# Patient Record
Sex: Female | Born: 1975 | Race: White | Hispanic: No | Marital: Married | Smoking: Current some day smoker
Health system: Southern US, Community
[De-identification: ages and names within clinical notes are randomized; demographics above are authoritative.]

## PROBLEM LIST (undated history)

## (undated) DIAGNOSIS — K8301 Primary sclerosing cholangitis: Secondary | ICD-10-CM

## (undated) DIAGNOSIS — I8500 Esophageal varices without bleeding: Secondary | ICD-10-CM

## (undated) DIAGNOSIS — K922 Gastrointestinal hemorrhage, unspecified: Secondary | ICD-10-CM

## (undated) HISTORY — PX: OTHER SURGICAL HISTORY: SHX169

---

## 2009-02-02 ENCOUNTER — Ambulatory Visit: Payer: Self-pay | Admitting: Internal Medicine

## 2011-01-03 ENCOUNTER — Ambulatory Visit: Payer: Self-pay

## 2018-08-10 ENCOUNTER — Encounter: Payer: Self-pay | Admitting: Family Medicine

## 2018-08-10 ENCOUNTER — Ambulatory Visit (INDEPENDENT_AMBULATORY_CARE_PROVIDER_SITE_OTHER): Payer: 59 | Admitting: Family Medicine

## 2018-08-10 ENCOUNTER — Other Ambulatory Visit: Payer: Self-pay

## 2018-08-10 DIAGNOSIS — Z7689 Persons encountering health services in other specified circumstances: Secondary | ICD-10-CM

## 2018-08-10 DIAGNOSIS — K591 Functional diarrhea: Secondary | ICD-10-CM | POA: Diagnosis not present

## 2018-08-10 NOTE — Patient Instructions (Addendum)
Try to limit artifical sweeteners, dairy, triggering foods, caffeine  Try fiber supplement, probiotic, may try imodium   DUE for FASTING BLOOD WORK (no food or drink after midnight before the lab appointment, only water or coffee without cream/sugar on the morning of)  SCHEDULE "Lab Only" visit in the morning at the clinic for lab draw in 1-3 MONTHS   - Make sure Lab Only appointment is at about 1 week before your next appointment, so that results will be available  For Lab Results, once available within 2-3 days of blood draw, you can can log in to MyChart online to view your results and a brief explanation. Also, we can discuss results at next follow-up visit.   Please schedule a Follow-up Appointment to: Return in about 3 months (around 11/10/2018) for annual physical.  If you have any other questions or concerns, please feel free to call the office or send a message through MyChart. You may also schedule an earlier appointment if necessary.  Additionally, you may be receiving a survey about your experience at our office within a few days to 1 week by e-mail or mail. We value your feedback.  Saralyn Pilar, DO [facility], [place]

## 2018-08-10 NOTE — Progress Notes (Signed)
Subjective:    Patient ID: Kristin Brooks, female    DOB: May 11, 1975, 43 y.o.   MRN: [id number]  Kristin Brooks is a 43 y.o. female presenting on 08/10/2018 for Establish Care (presistent diarrhea x 1.5 weeks , nauseated only few hours on Wednesday. That happen after eating. )   Virtual / Telehealth Encounter - Video Visit via [email or web address] The purpose of this virtual visit is to provide medical care while limiting exposure to the novel coronavirus (COVID19) for both patient and office staff.  Consent was obtained for remote visit:  Yes.   Answered questions that patient had about telehealth interaction:  Yes.   I discussed the limitations, risks, security and privacy concerns of performing an evaluation and management service by video/telephone. I also discussed with the patient that there may be a patient responsible charge related to this service. The patient expressed understanding and agreed to proceed.  Patient Location: Home Provider Location: [facility])  Previous PCP Dr Elease Hashimoto at [facility], now re-establishing with new PCP here at [facility]  HPI   DIARRHEA Reports symptoms onset 1.5 weeks ago with diarrhea episodes usually worse in AM after eating, often will have episodes in morning x 2, and then during day often does fairly well, sometimes after lunch may have episodes of diarrhea. Previous bowel habits were usually 1-2 x bowel movement a day, previous stools were normal without loose or hard dry, describes current stool as more watery and some orange to light brown matter, not taking digestive supplement, has not had similar problem before, she took some activated charcoal without relief. She did not identify specific foods or triggers, even plain foods can trigger. She previously used an organic half and half creamer, now tried a non dairy creamer in coffee drinks half cup daily now was drinking more - No sick contacts. Currently close contact at home  husband and grandmother do not have these symptoms, no evidence of contagion. - No known exposure to C DIff at work. Never had before. 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 Not tried imodium. - No prior colonoscopy. No fam history of colon cancer or other fam history of GI illness. Denies any abdominal pain or cramping, dark stool, rectal bleeding, fever chills, bloating, gas, urinary frequency or dysuria   Depression screen [facility] 2/9 08/10/2018  Decreased Interest 0  Down, Depressed, Hopeless 0  PHQ - 2 Score 0    History reviewed. No pertinent past medical history. History reviewed. No pertinent surgical history. Social History   Socioeconomic History  . Marital status: Married    Spouse name: Not on file  . Number of children: Not on file  . Years of education: Not on file  . Highest education level: Not on file  Occupational History  . Not on file  Social Needs  . Financial resource strain: Not on file  . Food insecurity:    Worry: Not on file    Inability: Not on file  . Transportation needs:    Medical: Not on file    Non-medical: Not on file  Tobacco Use  . Smoking status: Current Some Day Smoker    Packs/day: 0.10    Years: 3.00    Pack years: 0.30    Types: Cigarettes  . Smokeless tobacco: Never Used  Substance and Sexual Activity  . Alcohol use: Never    Frequency: Never  . Drug  use: Never  . Sexual activity: Not on file  Lifestyle  . Physical activity:    Days per week: Not on file    Minutes per session: Not on file  . Stress: Not on file  Relationships  . Social connections:    Talks on phone: Not on file    Gets together: Not on file    Attends religious service: Not on file    Active member of club or organization: Not on file    Attends meetings of clubs or organizations: Not on file    Relationship status: Not on file  . Intimate partner violence:    Fear of current or ex  partner: Not on file    Emotionally abused: Not on file    Physically abused: Not on file    Forced sexual activity: Not on file  Other Topics Concern  . Not on file  Social History Narrative  . Not on file   Family History  Problem Relation Age of Onset  . Heart disease Father    No current outpatient medications on file prior to visit.   No current facility-administered medications on file prior to visit.     Review of Systems Per HPI unless specifically indicated above      Objective:    There were no vitals taken for this visit.  Wt Readings from Last 3 Encounters:  No data found for Wt    Physical Exam   Note examination was completely remotely via video observation objective data only  Gen - well-appearing, no acute distress or apparent pain, comfortable HEENT - eyes appear clear without discharge or redness Heart/Lungs - cannot examine virtually - observed no evidence of coughing or labored breathing. Abd - cannot examine virtually  Neuro - awake, alert, oriented Psych - not anxious appearing   No results found for this or any previous visit.    Assessment & Plan:   Problem List Items Addressed This Visit    None    Visit Diagnoses    Functional diarrhea    -  Primary   Encounter to establish care with new doctor          Clinically with functional diarrhea, seems persistent 1.5 weeks triggered or predictable with meals, does not seem infectious in nature, no other constellation of symptoms, with absence of other GI symptoms except one day of nausea, no other GI red flags. - Less likely to be other more significant GI infection - COVID19 or C Diff, lack of contact exposure, and no antibiotics to trigger C Diff, also asymptomatic otherwise. - Seems to be maintaining nutrition, not at risk of dehydration or other deficiency  Plan - Discussed lifestyle modifications for diet to limit diarrhea, try small frequent meals, avoid larger meal, trigger foods if  possible, limit dairy, avoid any artificial sweetener creamer candy mint or gum - May try OTC anti diarrheal medication or relief, caution with imodium but may use short term - Lack of abdominal pain and cramping, will defer dicyclomine - Try fiber supplement and probiotic for restoring normal bowel function - Monitor symptoms over next 1-2 weeks Follow-up if not improving or worsening, consider referral to GI after 2-4 more week if unresolved=   No orders of the defined types were placed in this encounter.   Follow-up: - Return in 1-3 months for annual physical - Future labs request orders prior to physical when ready to schedule  Patient verbalizes understanding with the above medical recommendations including  the limitation of remote medical advice.  Specific follow-up and call-back criteria were given for patient to follow-up or seek medical care more urgently if needed.  Total duration of direct patient care provided via video conference: 20 minutes  Saralyn Pilar, DO [facility] 08/10/2018, 3:28 PM

## 2018-08-15 ENCOUNTER — Telehealth: Payer: Self-pay | Admitting: Family Medicine

## 2018-08-15 DIAGNOSIS — K591 Functional diarrhea: Secondary | ICD-10-CM

## 2018-08-15 NOTE — Telephone Encounter (Signed)
She was treated by video visit on 08/10/18 discussed recommendations for her diarrhea. She was advised that if not better in 2 to 4 weeks could refer her to Gastroentrology GI specialist.  If she wants, we can go ahead and submit this referral now. I do not have many other treatment options, other than what I recommended. She declined the medicine for abdominal cramping because she was not having that symptom at the time.  Let me know.  Nobie Putnam, [facility] 08/15/2018, 1:15 PM

## 2018-08-15 NOTE — Telephone Encounter (Signed)
Pt had appt Friday said that she was not better please advise

## 2018-08-16 NOTE — Telephone Encounter (Signed)
She would like to proceed with GI referral probiotics and fibers not helping as per patient.

## 2018-08-16 NOTE — Telephone Encounter (Signed)
Referral to [facility] signed  Nobie Putnam, [facility] 08/16/2018, 12:12 PM

## 2018-08-16 NOTE — Telephone Encounter (Signed)
Left message for patient to call back  

## 2018-08-27 ENCOUNTER — Telehealth: Payer: Self-pay | Admitting: Gastroenterology

## 2018-08-27 ENCOUNTER — Ambulatory Visit (INDEPENDENT_AMBULATORY_CARE_PROVIDER_SITE_OTHER): Payer: 59 | Admitting: Gastroenterology

## 2018-08-27 ENCOUNTER — Other Ambulatory Visit: Payer: Self-pay

## 2018-08-27 DIAGNOSIS — R197 Diarrhea, unspecified: Secondary | ICD-10-CM

## 2018-08-27 NOTE — Telephone Encounter (Signed)
08/27/18  F/u 6-8 weeks.

## 2018-08-27 NOTE — Progress Notes (Signed)
Kristin BouillonVarnita Bladimir Brooks [address]  [address]  [place], [place] [address]  Main: [phone]  Fax: [phone]   Gastroenterology Consultation  Referring Provider:     Saralyn PilarKaramalegos, Alexander * Primary Care Physician:  Smitty CordsKaramalegos, Alexander J, DO Reason for Consultation:     Diarrhea        HPI:   Virtual Visit via Video Note  I connected with patient on 08/27/18 at 10:15 AM EDT by video ([email or web address]) and verified that I am speaking with the correct person using two identifiers.   I discussed the limitations, risks, security and privacy concerns of performing an evaluation and management service by video and the availability of in person appointments. I also discussed with the patient that there may be a patient responsible charge related to this service. The patient expressed understanding and agreed to proceed.  Location of the patient: Home Location of provider: Home Participating persons: Patient and provider only (Nursing staff checked in patient via phone but were not physically involved in the video interaction - see their notes)   History of Present Illness: Chief Complaint  Patient presents with  . New Patient (Initial Visit)    referred for functional diarrhea    Kristin Brooks is a 43 y.o. y/o female referred for consultation & management  by Dr. Althea CharonKaramalegos, Netta NeatAlexander J, DO.  Patient visited her primary care provider on June 5 due to diarrhea and reports symptoms started 1 week prior to that.  It consisted of 1-2 loose bowel movements in the morning.  2 days prior to symptom onset she had eaten at cookout.  Denies any abdominal pain, cramping, nausea or vomiting, heartburn, dysphagia, blood in stool, weight loss.  No family history of colon cancer.  No prior colonoscopy.  patient started taking Imodium once daily about 1 week ago and this has helped.  She states her last bowel movement was solid and not loose.  No past medical history on file.  No past surgical  history on file.  Prior to Admission medications   Not on File    Family History  Problem Relation Age of Onset  . Heart disease Father      Social History   Tobacco Use  . Smoking status: Current Some Day Smoker    Packs/day: 0.10    Years: 3.00    Pack years: 0.30    Types: Cigarettes  . Smokeless tobacco: Never Used  Substance Use Topics  . Alcohol use: Never    Frequency: Never  . Drug use: Never    Allergies as of 08/27/2018  . (No Known Allergies)    Review of Systems:    All systems reviewed and negative except where noted in HPI.   Observations/Objective:  Labs: CBC No results found for: WBC, RBC, HGB, HCT, PLT, MCV, MCH, MCHC, RDW, LYMPHSABS, MONOABS, EOSABS, BASOSABS CMP  No results found for: NA, K, CL, CO2, GLUCOSE, BUN, CREATININE, CALCIUM, PROT, ALBUMIN, AST, ALT, ALKPHOS, BILITOT, GFRNONAA, GFRAA  Imaging Studies: No results found.  Assessment and Plan:   Kristin Brooks is a 43 y.o. y/o female has been referred for diarrhea  Assessment and Plan: Symptoms most consistent with likely infectious diarrhea that is improving at this time  We will obtain GI panel to rule out C. difficile and other infectious causes  As long as diarrhea continues to improve, no further intervention indicated However, if diarrhea does not improve, can consider colonoscopy  Patient advised to hydrate well Avoid caffeine and  products with sugar soda or sugar substitutes.  She verbalized understanding.  Follow Up Instructions: Follow-up in 6 to 8 weeks to reassess symptoms  I discussed the assessment and treatment plan with the patient. The patient was provided an opportunity to ask questions and all were answered. The patient agreed with the plan and demonstrated an understanding of the instructions.   The patient was advised to call back or seek an in-person evaluation if the symptoms worsen or if the condition fails to improve as anticipated.  I provided 30  minutes of face-to-face time via video software during this encounter.  Additional time was spent in reviewing patient's chart, placing orders etc.   Virgel Manifold, MD  Speech recognition software was used to dictate the above note.

## 2018-09-03 ENCOUNTER — Telehealth: Payer: Self-pay | Admitting: Gastroenterology

## 2018-09-03 LAB — GI PROFILE, STOOL, PCR

## 2018-09-03 NOTE — Telephone Encounter (Signed)
Patient Kristin Brooks wanting her test results. She is still having problems.

## 2018-09-03 NOTE — Telephone Encounter (Signed)
Patient called & would like results from her stool sample from last Wednesday. Please call.

## 2018-09-03 NOTE — Progress Notes (Signed)
See note already documented on 6/22

## 2018-09-04 ENCOUNTER — Telehealth: Payer: Self-pay | Admitting: Family Medicine

## 2018-09-04 DIAGNOSIS — K591 Functional diarrhea: Secondary | ICD-10-CM

## 2018-09-04 NOTE — Telephone Encounter (Signed)
Pt. Called said that she was not pleased with Dr. Gabriel Carina ( referral GI office ) requesting a new referral to Dr  Alice Reichert at  [facility].

## 2018-09-04 NOTE — Telephone Encounter (Signed)
If this is patient's preference, then we can try to send new referral to [facility] Dr Alice Reichert. I am not sure on the wait time. I generally recommend that patient's at least follow-up once more with the same specialist to address any problem that they feel was not properly addressed the first time, then if still having issue can send new referral.  But if she truly prefers to switch, that is her preference, and I will go ahead and send this referral now.  Nobie Putnam, [facility] 09/04/2018, 12:56 PM

## 2018-09-06 NOTE — Telephone Encounter (Signed)
Pt has been notified of results but states she wants to think about colonoscopy before she schedules so she will call back if this is they way she wants to go

## 2018-09-12 ENCOUNTER — Other Ambulatory Visit (HOSPITAL_COMMUNITY): Payer: Self-pay | Admitting: Gastroenterology

## 2018-09-12 ENCOUNTER — Other Ambulatory Visit: Payer: Self-pay | Admitting: Gastroenterology

## 2018-09-12 DIAGNOSIS — R7989 Other specified abnormal findings of blood chemistry: Secondary | ICD-10-CM

## 2018-09-20 ENCOUNTER — Ambulatory Visit
Admission: RE | Admit: 2018-09-20 | Discharge: 2018-09-20 | Disposition: A | Discharge status: Home / Self Care | Payer: 59 | Source: Ambulatory Visit | Attending: Gastroenterology | Admitting: Gastroenterology

## 2018-09-20 ENCOUNTER — Other Ambulatory Visit: Payer: Self-pay

## 2018-09-20 DIAGNOSIS — R7989 Other specified abnormal findings of blood chemistry: Secondary | ICD-10-CM

## 2018-09-20 DIAGNOSIS — R945 Abnormal results of liver function studies: Secondary | ICD-10-CM | POA: Insufficient documentation

## 2018-09-21 ENCOUNTER — Other Ambulatory Visit: Payer: Self-pay | Admitting: Gastroenterology

## 2018-09-21 DIAGNOSIS — K7689 Other specified diseases of liver: Secondary | ICD-10-CM

## 2018-09-21 DIAGNOSIS — R935 Abnormal findings on diagnostic imaging of other abdominal regions, including retroperitoneum: Secondary | ICD-10-CM

## 2018-09-21 DIAGNOSIS — K828 Other specified diseases of gallbladder: Secondary | ICD-10-CM

## 2018-09-21 DIAGNOSIS — K769 Liver disease, unspecified: Secondary | ICD-10-CM

## 2018-09-28 ENCOUNTER — Other Ambulatory Visit: Payer: Self-pay

## 2018-09-28 ENCOUNTER — Encounter
Admission: RE | Admit: 2018-09-28 | Discharge: 2018-09-28 | Disposition: A | Discharge status: Home / Self Care | Payer: 59 | Source: Ambulatory Visit | Attending: Gastroenterology | Admitting: Gastroenterology

## 2018-09-28 DIAGNOSIS — R935 Abnormal findings on diagnostic imaging of other abdominal regions, including retroperitoneum: Secondary | ICD-10-CM | POA: Diagnosis present

## 2018-09-28 DIAGNOSIS — K828 Other specified diseases of gallbladder: Secondary | ICD-10-CM | POA: Diagnosis present

## 2018-09-28 MED ORDER — TECHNETIUM TC 99M MEBROFENIN IV KIT
5.0000 | PACK | Freq: Once | INTRAVENOUS | Status: AC | PRN
  Administered 2018-09-28: 12:00:00 4.83 via INTRAVENOUS

## 2018-10-04 ENCOUNTER — Ambulatory Visit
Admission: RE | Admit: 2018-10-04 | Discharge: 2018-10-04 | Disposition: A | Discharge status: Home / Self Care | Payer: 59 | Source: Ambulatory Visit | Attending: Gastroenterology | Admitting: Gastroenterology

## 2018-10-04 ENCOUNTER — Other Ambulatory Visit: Payer: Self-pay

## 2018-10-04 DIAGNOSIS — K7689 Other specified diseases of liver: Secondary | ICD-10-CM | POA: Diagnosis present

## 2018-10-04 DIAGNOSIS — K769 Liver disease, unspecified: Secondary | ICD-10-CM | POA: Insufficient documentation

## 2018-10-04 MED ORDER — GADOBUTROL 1 MMOL/ML IV SOLN
10.0000 mL | Freq: Once | INTRAVENOUS | Status: AC | PRN
  Administered 2018-10-04: 9 mL via INTRAVENOUS

## 2018-10-09 ENCOUNTER — Ambulatory Visit: Payer: 59 | Admitting: Gastroenterology

## 2020-02-25 IMAGING — US ULTRASOUND ABDOMEN LIMITED
1 series · 13 of 25 positions shown · non-contrast
Comparison: None.

CLINICAL DATA: Elevated liver enzymes

EXAM:
ULTRASOUND ABDOMEN LIMITED RIGHT UPPER QUADRANT

[Series 1: ultrasound abdomen limited · 0.22mm/px · 13 of 65 slices shown]
[im 1/65]
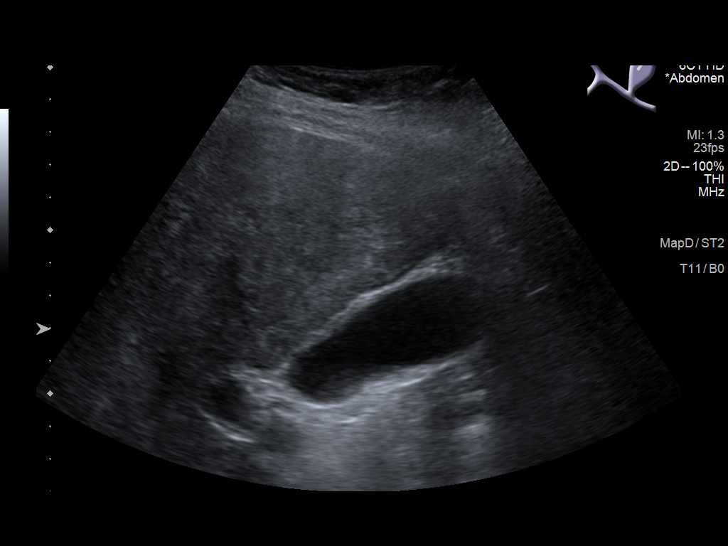
[im 6/65]
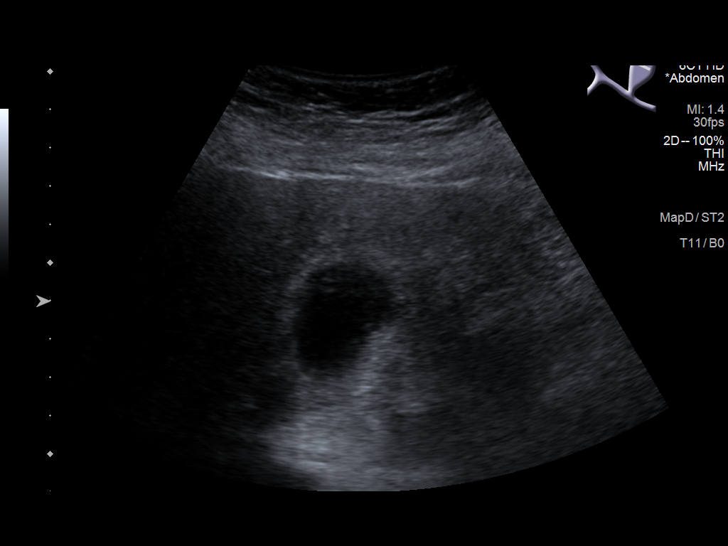
[im 11/65]
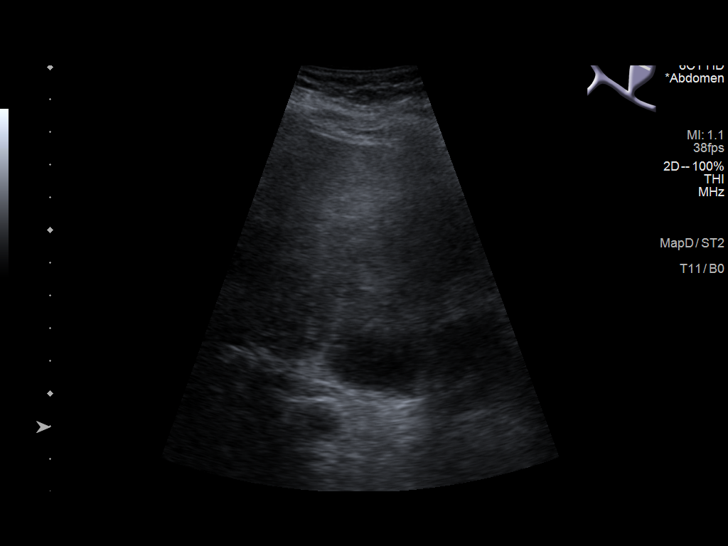
[im 17/65]
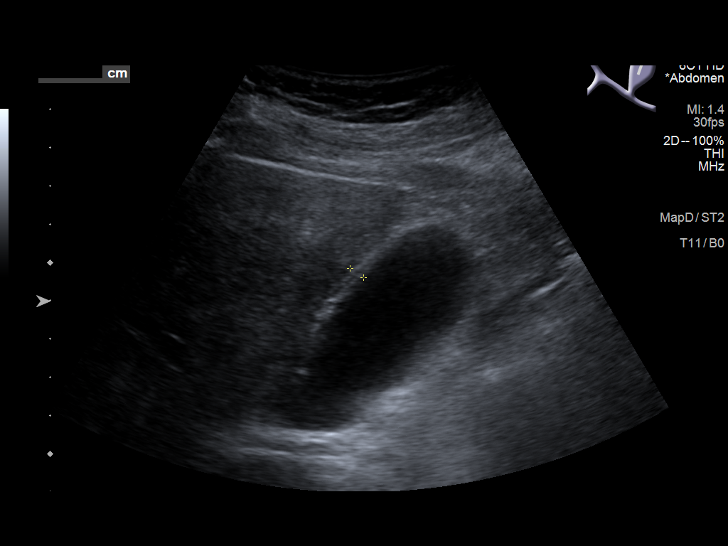
[im 22/65]
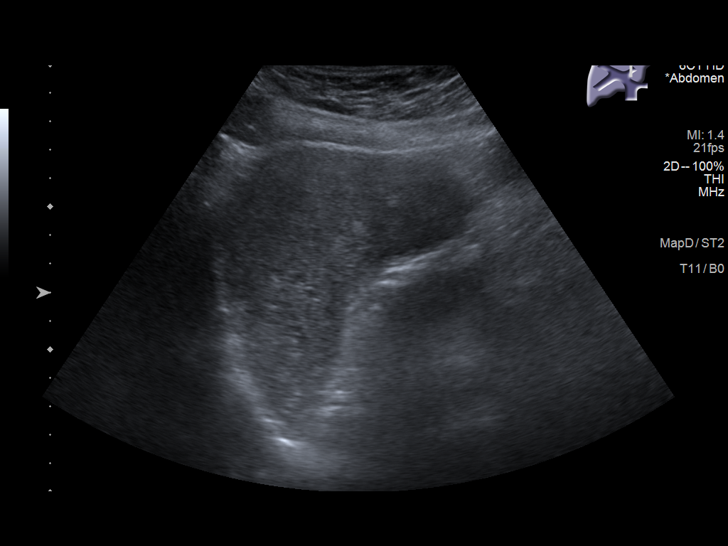
[im 27/65]
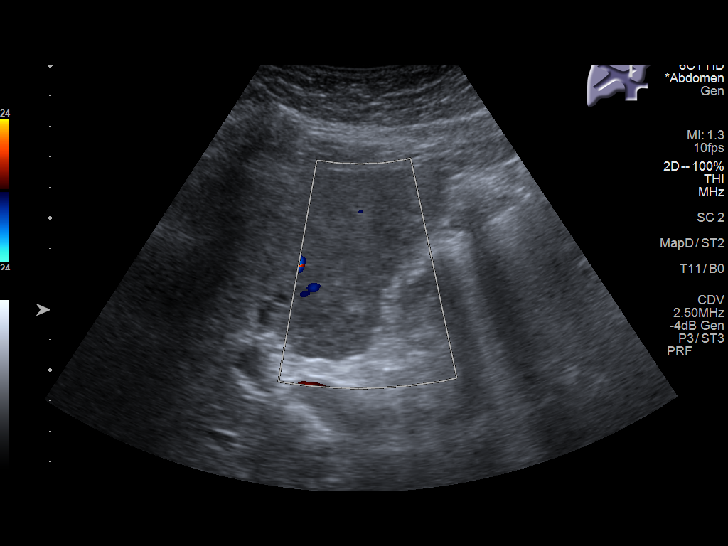
[im 33/65]
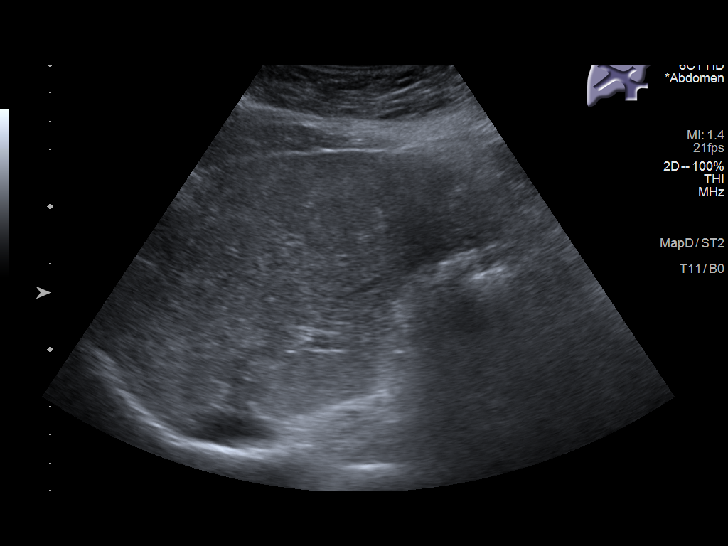
[im 38/65]
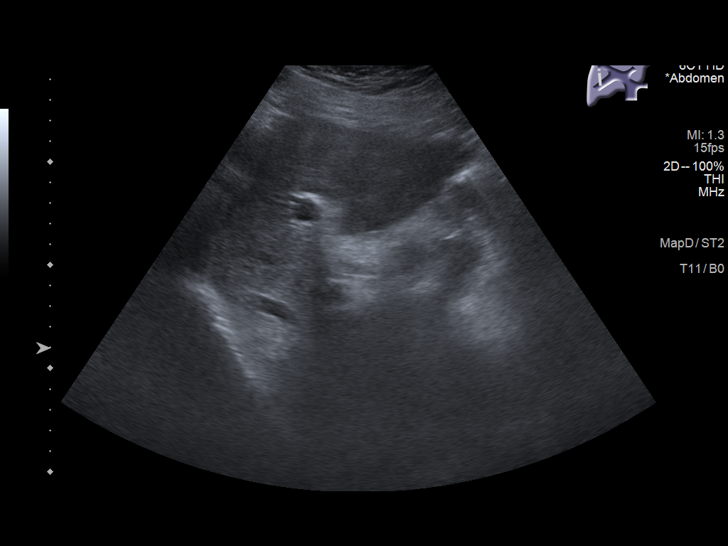
[im 43/65]
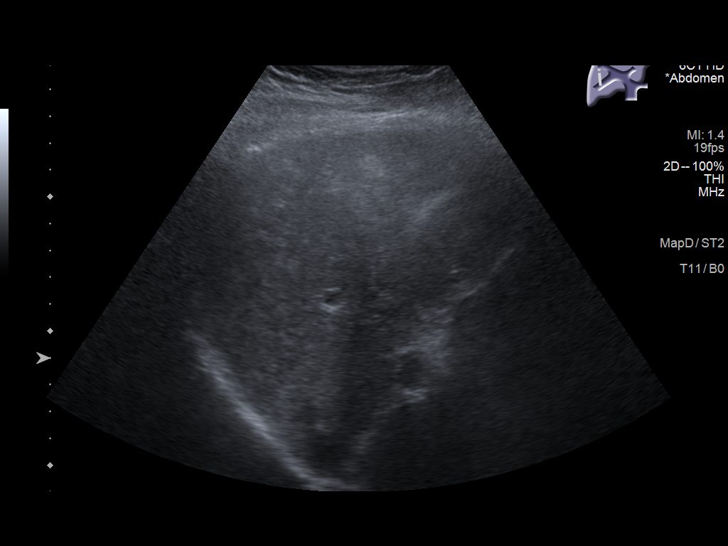
[im 49/65]
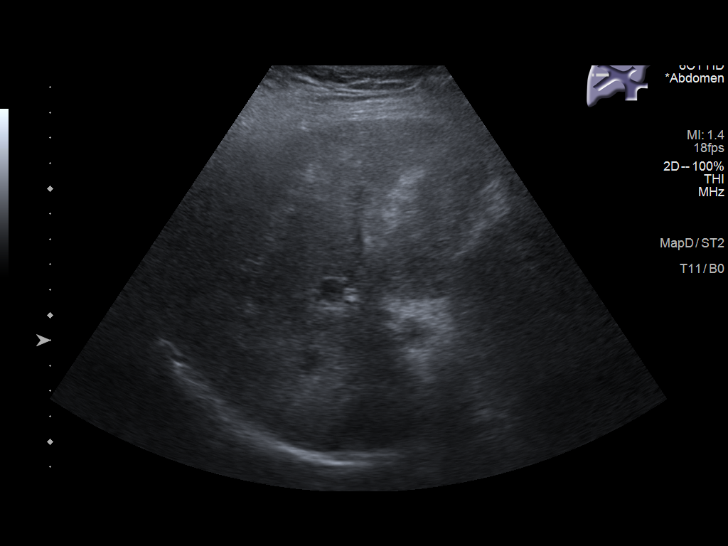
[im 54/65]
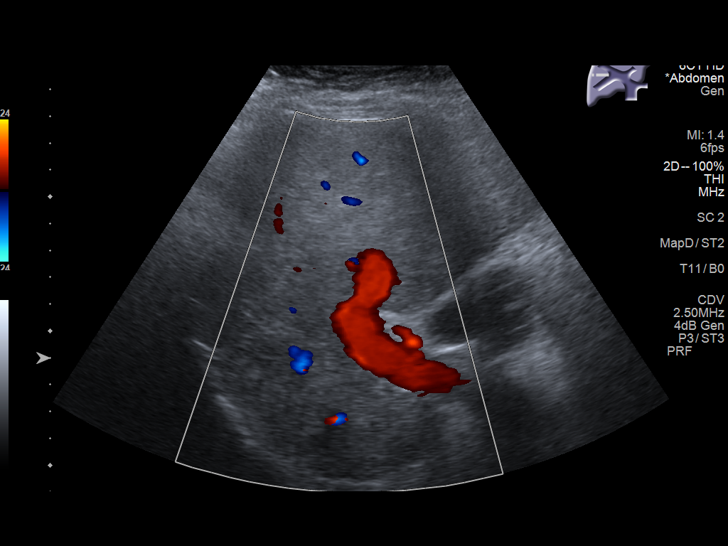
[im 59/65]
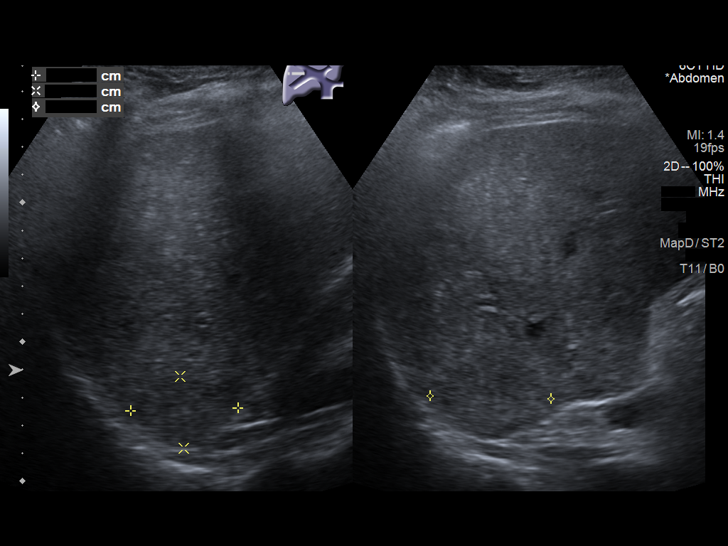
[im 65/65]
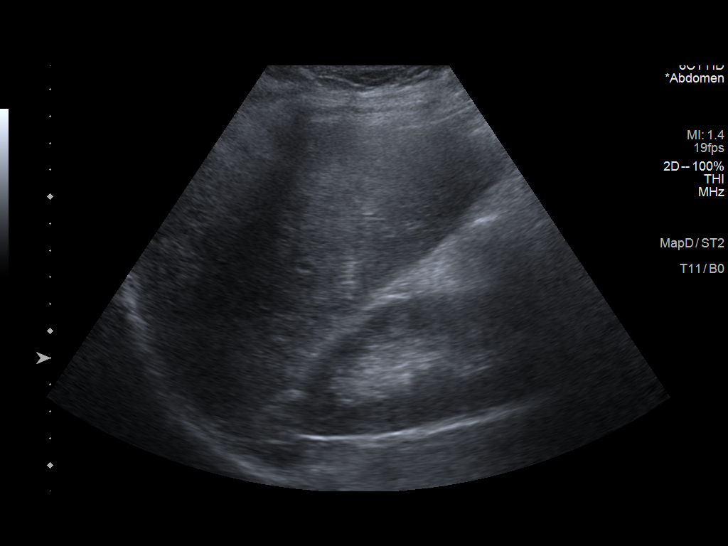

[13 of 25 positions shown; findings below may reference images not displayed]

FINDINGS: Gallbladder:

No gallstones evident. Gallbladder wall appears slightly thickened.
No pericholecystic fluid. No sonographic Murphy sign noted by
sonographer.

Common bile duct:

Diameter: Mm. No intrahepatic or extrahepatic biliary duct
dilatation.

Liver:

There is a subtle area of decreased attenuation in the right lobe of
the liver measuring 3.9 x 2.6 x 4.3 cm. Liver echogenicity is
overall increased and rather coarse. Portal vein is patent on color
Doppler imaging with normal direction of blood flow towards the
liver.
IMPRESSION: 1. Gallbladder wall appears mildly thickened without evident
gallstones or pericholecystic fluid. This appearance potentially
could represent early acalculous cholecystitis. This circumstance
may warrant nuclear medicine hepatobiliary imaging study to assess
for cystic duct patency.

2. Liver echogenicity is overall increased and somewhat coarse. The
appearance is indicative of hepatic steatosis with potential
underlying parenchymal liver disease. There is a area of subtle
decreased echogenicity right lobe of the liver near the dome region
measuring 3.9 x 2.6 x 4.3 cm. This area is of uncertain etiology. It
warrants further assessment. In this regard, would advise MR or CT
pre and serial post-contrast of the liver to further assess.

These results will be called to the ordering clinician or
representative by the Radiologist Assistant, and communication
documented in the PACS or zVision Dashboard.

## 2020-06-05 ENCOUNTER — Encounter: Payer: Self-pay | Admitting: Family Medicine

## 2020-06-05 DIAGNOSIS — Z85828 Personal history of other malignant neoplasm of skin: Secondary | ICD-10-CM | POA: Insufficient documentation

## 2020-06-05 DIAGNOSIS — Z9889 Other specified postprocedural states: Secondary | ICD-10-CM | POA: Insufficient documentation

## 2020-11-18 ENCOUNTER — Ambulatory Visit
Admission: RE | Admit: 2020-11-18 | Payer: BC Managed Care – PPO | Source: Home / Self Care | Admitting: Internal Medicine

## 2020-11-18 ENCOUNTER — Encounter: Admission: RE | Payer: Self-pay | Source: Home / Self Care

## 2020-11-18 SURGERY — EGD (ESOPHAGOGASTRODUODENOSCOPY)
Anesthesia: General

## 2021-05-10 ENCOUNTER — Telehealth: Payer: Self-pay

## 2021-05-10 NOTE — Telephone Encounter (Signed)
Scheduled for 06/30/2021

## 2021-06-30 ENCOUNTER — Ambulatory Visit: Payer: BC Managed Care – PPO | Admitting: Gastroenterology

## 2021-07-20 ENCOUNTER — Other Ambulatory Visit: Payer: Self-pay | Admitting: Gastroenterology

## 2021-07-20 ENCOUNTER — Other Ambulatory Visit: Payer: Self-pay | Admitting: General Surgery

## 2021-07-20 ENCOUNTER — Other Ambulatory Visit (HOSPITAL_COMMUNITY): Payer: Self-pay | Admitting: General Surgery

## 2021-07-20 DIAGNOSIS — K743 Primary biliary cirrhosis: Secondary | ICD-10-CM

## 2021-07-20 DIAGNOSIS — K7460 Unspecified cirrhosis of liver: Secondary | ICD-10-CM

## 2021-07-20 DIAGNOSIS — I8500 Esophageal varices without bleeding: Secondary | ICD-10-CM

## 2021-09-20 ENCOUNTER — Encounter: Admission: RE | Payer: Self-pay | Source: Home / Self Care

## 2021-09-20 ENCOUNTER — Ambulatory Visit
Admission: RE | Admit: 2021-09-20 | Payer: BC Managed Care – PPO | Source: Home / Self Care | Admitting: Gastroenterology

## 2021-09-20 SURGERY — EGD (ESOPHAGOGASTRODUODENOSCOPY)
Anesthesia: General

## 2022-03-16 ENCOUNTER — Emergency Department: Payer: BC Managed Care – PPO

## 2022-03-16 ENCOUNTER — Encounter: Payer: Self-pay | Admitting: Internal Medicine

## 2022-03-16 ENCOUNTER — Other Ambulatory Visit: Payer: Self-pay

## 2022-03-16 ENCOUNTER — Inpatient Hospital Stay
Admission: EM | Admit: 2022-03-16 | Discharge: 2022-03-18 | DRG: 186 | Disposition: A | Discharge status: Home / Self Care | Payer: BC Managed Care – PPO | Attending: Osteopathic Medicine | Admitting: Osteopathic Medicine

## 2022-03-16 DIAGNOSIS — Z1152 Encounter for screening for COVID-19: Secondary | ICD-10-CM | POA: Diagnosis not present

## 2022-03-16 DIAGNOSIS — D72819 Decreased white blood cell count, unspecified: Secondary | ICD-10-CM | POA: Diagnosis present

## 2022-03-16 DIAGNOSIS — K766 Portal hypertension: Secondary | ICD-10-CM | POA: Diagnosis present

## 2022-03-16 DIAGNOSIS — Z8249 Family history of ischemic heart disease and other diseases of the circulatory system: Secondary | ICD-10-CM

## 2022-03-16 DIAGNOSIS — I8510 Secondary esophageal varices without bleeding: Secondary | ICD-10-CM | POA: Diagnosis present

## 2022-03-16 DIAGNOSIS — K743 Primary biliary cirrhosis: Secondary | ICD-10-CM | POA: Diagnosis present

## 2022-03-16 DIAGNOSIS — R222 Localized swelling, mass and lump, trunk: Secondary | ICD-10-CM

## 2022-03-16 DIAGNOSIS — K7581 Nonalcoholic steatohepatitis (NASH): Secondary | ICD-10-CM | POA: Diagnosis present

## 2022-03-16 DIAGNOSIS — R0602 Shortness of breath: Principal | ICD-10-CM

## 2022-03-16 DIAGNOSIS — K8301 Primary sclerosing cholangitis: Secondary | ICD-10-CM | POA: Diagnosis present

## 2022-03-16 DIAGNOSIS — J9601 Acute respiratory failure with hypoxia: Secondary | ICD-10-CM

## 2022-03-16 DIAGNOSIS — J90 Pleural effusion, not elsewhere classified: Secondary | ICD-10-CM | POA: Diagnosis present

## 2022-03-16 DIAGNOSIS — J189 Pneumonia, unspecified organism: Secondary | ICD-10-CM

## 2022-03-16 DIAGNOSIS — Z79899 Other long term (current) drug therapy: Secondary | ICD-10-CM

## 2022-03-16 DIAGNOSIS — K7460 Unspecified cirrhosis of liver: Secondary | ICD-10-CM | POA: Insufficient documentation

## 2022-03-16 DIAGNOSIS — K900 Celiac disease: Secondary | ICD-10-CM | POA: Diagnosis present

## 2022-03-16 DIAGNOSIS — D759 Disease of blood and blood-forming organs, unspecified: Secondary | ICD-10-CM

## 2022-03-16 DIAGNOSIS — R911 Solitary pulmonary nodule: Secondary | ICD-10-CM | POA: Diagnosis present

## 2022-03-16 DIAGNOSIS — Z8719 Personal history of other diseases of the digestive system: Secondary | ICD-10-CM

## 2022-03-16 DIAGNOSIS — R7989 Other specified abnormal findings of blood chemistry: Secondary | ICD-10-CM

## 2022-03-16 DIAGNOSIS — F17210 Nicotine dependence, cigarettes, uncomplicated: Secondary | ICD-10-CM | POA: Diagnosis present

## 2022-03-16 DIAGNOSIS — R188 Other ascites: Secondary | ICD-10-CM

## 2022-03-16 DIAGNOSIS — R0902 Hypoxemia: Secondary | ICD-10-CM

## 2022-03-16 HISTORY — DX: Gastrointestinal hemorrhage, unspecified: K92.2

## 2022-03-16 HISTORY — DX: Esophageal varices without bleeding: I85.00

## 2022-03-16 HISTORY — DX: Primary sclerosing cholangitis: K83.01

## 2022-03-16 LAB — BODY FLUID CELL COUNT WITH DIFFERENTIAL
Eos, Fluid: 0 %
Lymphs, Fluid: 62 %
Monocyte-Macrophage-Serous Fluid: 22 % — ABNORMAL LOW (ref 50–90)
Neutrophil Count, Fluid: 11 % (ref 0–25)
Other Cells, Fluid: 3 %
Total Nucleated Cell Count, Fluid: 625 cu mm (ref 0–1000)

## 2022-03-16 LAB — CBC WITH DIFFERENTIAL/PLATELET
Abs Immature Granulocytes: 0.01 10*3/uL (ref 0.00–0.07)
Basophils Absolute: 0 10*3/uL (ref 0.0–0.1)
Basophils Relative: 1 %
Eosinophils Absolute: 0.3 10*3/uL (ref 0.0–0.5)
Eosinophils Relative: 7 %
HCT: 43.6 % (ref 36.0–46.0)
Hemoglobin: 14.2 g/dL (ref 12.0–15.0)
Immature Granulocytes: 0 %
Lymphocytes Relative: 26 %
Lymphs Abs: 1 10*3/uL (ref 0.7–4.0)
MCH: 28.3 pg (ref 26.0–34.0)
MCHC: 32.6 g/dL (ref 30.0–36.0)
MCV: 86.9 fL (ref 80.0–100.0)
Monocytes Absolute: 0.2 10*3/uL (ref 0.1–1.0)
Monocytes Relative: 5 %
Neutro Abs: 2.4 10*3/uL (ref 1.7–7.7)
Neutrophils Relative %: 61 %
Platelets: 69 10*3/uL — ABNORMAL LOW (ref 150–400)
RBC: 5.02 MIL/uL (ref 3.87–5.11)
RDW: 15.2 % (ref 11.5–15.5)
WBC: 3.9 10*3/uL — ABNORMAL LOW (ref 4.0–10.5)
nRBC: 0 % (ref 0.0–0.2)

## 2022-03-16 LAB — RESP PANEL BY RT-PCR (RSV, FLU A&B, COVID)  RVPGX2
Influenza A by PCR: NEGATIVE
Influenza B by PCR: NEGATIVE
Resp Syncytial Virus by PCR: NEGATIVE
SARS Coronavirus 2 by RT PCR: NEGATIVE

## 2022-03-16 LAB — COMPREHENSIVE METABOLIC PANEL
ALT: 28 U/L (ref 0–44)
AST: 65 U/L — ABNORMAL HIGH (ref 15–41)
Albumin: 3.3 g/dL — ABNORMAL LOW (ref 3.5–5.0)
Alkaline Phosphatase: 155 U/L — ABNORMAL HIGH (ref 38–126)
Anion gap: 8 (ref 5–15)
BUN: 17 mg/dL (ref 6–20)
CO2: 20 mmol/L — ABNORMAL LOW (ref 22–32)
Calcium: 8.4 mg/dL — ABNORMAL LOW (ref 8.9–10.3)
Chloride: 110 mmol/L (ref 98–111)
Creatinine, Ser: 0.59 mg/dL (ref 0.44–1.00)
GFR, Estimated: >60 mL/min (ref >60)
Glucose, Bld: 99 mg/dL (ref 70–99)
Potassium: 3.5 mmol/L (ref 3.5–5.1)
Sodium: 138 mmol/L (ref 135–145)
Total Bilirubin: 1 mg/dL (ref 0.3–1.2)
Total Protein: 8.2 g/dL — ABNORMAL HIGH (ref 6.5–8.1)

## 2022-03-16 LAB — CREATININE, FLUID (PLEURAL, PERITONEAL, JP DRAINAGE): Creat, Fluid: 0.5 mg/dL

## 2022-03-16 LAB — PROTIME-INR
INR: 1.3 — ABNORMAL HIGH (ref 0.8–1.2)
Prothrombin Time: 16.2 seconds — ABNORMAL HIGH (ref 11.4–15.2)

## 2022-03-16 LAB — AMYLASE, PLEURAL OR PERITONEAL FLUID: Amylase, Fluid: 37 U/L

## 2022-03-16 LAB — GLUCOSE, PLEURAL OR PERITONEAL FLUID: Glucose, Fluid: 114 mg/dL

## 2022-03-16 LAB — LACTATE DEHYDROGENASE, PLEURAL OR PERITONEAL FLUID: LD, Fluid: 57 U/L — ABNORMAL HIGH (ref 3–23)

## 2022-03-16 LAB — ALBUMIN, PLEURAL OR PERITONEAL FLUID: Albumin, Fluid: <1.5 g/dL

## 2022-03-16 LAB — TROPONIN I (HIGH SENSITIVITY)
Troponin I (High Sensitivity): 2 ng/L (ref <18)
Troponin I (High Sensitivity): 2 ng/L (ref <18)

## 2022-03-16 LAB — PROTEIN, PLEURAL OR PERITONEAL FLUID: Total protein, fluid: <3 g/dL

## 2022-03-16 LAB — BRAIN NATRIURETIC PEPTIDE: B Natriuretic Peptide: 18.2 pg/mL (ref 0.0–100.0)

## 2022-03-16 MED ORDER — ACETAMINOPHEN 650 MG RE SUPP: 650.0000 mg | Freq: Four times a day (QID) | RECTAL | Status: DC | PRN

## 2022-03-16 MED ORDER — FUROSEMIDE 40 MG PO TABS
40.0000 mg | ORAL_TABLET | Freq: Every day | ORAL | Status: DC
  Administered 2022-03-16 – 2022-03-17 (×2): 40 mg via ORAL
  Filled 2022-03-16 (×2): qty 1

## 2022-03-16 MED ORDER — ACETAMINOPHEN 325 MG PO TABS: 650.0000 mg | ORAL_TABLET | Freq: Four times a day (QID) | ORAL | Status: DC | PRN

## 2022-03-16 MED ORDER — SODIUM CHLORIDE 0.9 % IV SOLN
500.0000 mg | INTRAVENOUS | Status: DC
  Administered 2022-03-16: 500 mg via INTRAVENOUS
  Filled 2022-03-16 (×2): qty 5

## 2022-03-16 MED ORDER — SODIUM CHLORIDE 0.9 % IV SOLN
2.0000 g | INTRAVENOUS | Status: DC
  Administered 2022-03-16: 2 g via INTRAVENOUS
  Filled 2022-03-16 (×2): qty 20

## 2022-03-16 MED ORDER — ONDANSETRON HCL 4 MG PO TABS: 4.0000 mg | ORAL_TABLET | Freq: Three times a day (TID) | ORAL | Status: DC | PRN

## 2022-03-16 MED ORDER — PANTOPRAZOLE SODIUM 40 MG PO TBEC
40.0000 mg | DELAYED_RELEASE_TABLET | Freq: Two times a day (BID) | ORAL | Status: DC
  Administered 2022-03-17 – 2022-03-18 (×2): 40 mg via ORAL
  Filled 2022-03-16 (×4): qty 1

## 2022-03-16 NOTE — Assessment & Plan Note (Signed)
Slightly atypical pattern for liver disease as the patient's hemoglobin is normal.  I will check B12 and folate

## 2022-03-16 NOTE — ED Provider Notes (Signed)
[facility] Provider Note    Event Date/Time   First MD Initiated Contact with Patient 03/16/22 1703     (approximate)   History   Shortness of Breath   HPI  Kristin Brooks is a 47 y.o. female   Past medical history of primary biliary cholangitis, varices, presents with shortness of breath for the last 4 days rapidly worsening and found to have opacification of lung field on chest x-ray today sent in after abnormal imaging.  denys chest pain or cough fever chills no other acute medical complaints.  Was otherwise in her regular state of health on Friday when symptoms developed and progressively quickly worsened Saturday and over the next few days.  Unable to walk across the room without feeling very short of breath.  She has a history of cigarette smoking for the last 10 years.  No night sweats or weight loss.  History was obtained via the patient.  Husband is at bedside as independent historian corroborates information given. Reviewed external medical notes including chest x-ray obtained earlier today and see report of complete opacification of the left hemithorax.     Physical Exam   Triage Vital Signs: ED Triage Vitals  Enc Vitals Group     BP 03/16/22 1541 137/84     Pulse Rate 03/16/22 1541 (!) 117     Resp 03/16/22 1541 20     Temp 03/16/22 1541 97.8 F (36.6 C)     Temp Source 03/16/22 1541 Oral     SpO2 03/16/22 1541 (!) 87 %     Weight 03/16/22 1542 225 lb (102.1 kg)     Height 03/16/22 1542 6' (1.829 m)     Head Circumference --      Peak Flow --      Pain Score 03/16/22 1542 0     Pain Loc --      Pain Edu? --      Excl. in [place]? --     Most recent vital signs: Vitals:   03/16/22 1541 03/16/22 1705  BP: 137/84 133/81  Pulse: (!) 117 (!) 105  Resp: 20 (!) 28  Temp: 97.8 F (36.6 C)   SpO2: (!) 87% 94%    General: Awake, no distress.  CV:  Good peripheral perfusion.  Resp:  Normal effort.  Abd:  No distention.   Other:  deCreased breath sounds on left side, no wheezing or focality.  Awake alert oriented, desaturates to the low 90s when talking on room air, placed on 2 L with improvement.   ED Results / Procedures / Treatments   Labs (all labs ordered are listed, but only abnormal results are displayed) Labs Reviewed  COMPREHENSIVE METABOLIC PANEL - Abnormal; Notable for the following components:      Result Value   CO2 20 (*)    Calcium 8.4 (*)    Total Protein 8.2 (*)    Albumin 3.3 (*)    AST 65 (*)    Alkaline Phosphatase 155 (*)    All other components within normal limits  CBC WITH DIFFERENTIAL/PLATELET - Abnormal; Notable for the following components:   WBC 3.9 (*)    Platelets 69 (*)    All other components within normal limits  PROTIME-INR - Abnormal; Notable for the following components:   Prothrombin Time 16.2 (*)    INR 1.3 (*)    All other components within normal limits  RESP PANEL BY RT-PCR (RSV, FLU A&B, COVID)  RVPGX2  BRAIN  NATRIURETIC PEPTIDE  TROPONIN I (HIGH SENSITIVITY)  TROPONIN I (HIGH SENSITIVITY)     I reviewed labs and they are notable for white blood cell count is low 3.9 and platelet count is low 69.  EKG  ED ECG REPORT I, Lucillie Garfinkel, the attending physician, personally viewed and interpreted this ECG.   Date: 03/16/2022  EKG Time: 1605  Rate: 111  Rhythm: sinus tachycardia  Axis: nl  Intervals:none  ST&T Change: No acute ischemic changes    RADIOLOGY I independently reviewed and interpreted chest x-ray and see whiteout of left lung field   PROCEDURES:  Critical Care performed: Yes, see critical care procedure note(s)  .Critical Care  Performed by: Lucillie Garfinkel, MD Authorized by: Lucillie Garfinkel, MD   Critical care provider statement:    Critical care time (minutes):  30   Critical care was necessary to treat or prevent imminent or life-threatening deterioration of the following conditions:  Respiratory failure   Critical care was time  spent personally by me on the following activities:  Development of treatment plan with patient or surrogate, discussions with consultants, evaluation of patient's response to treatment, examination of patient, ordering and review of laboratory studies, ordering and review of radiographic studies, ordering and performing treatments and interventions, pulse oximetry, re-evaluation of patient's condition and review of old charts    [facility] ED: Medications - No data to display  Consultants:  I spoke with hospitalist for admission and regarding care plan for this patient.   IMPRESSION / MDM / ASSESSMENT AND PLAN / ED COURSE  I reviewed the triage vital signs and the nursing notes.                              Differential diagnosis includes, but is not limited to, pleural effusion, malignant pleural effusion, pleural effusion due to hepatic disorder, infection hypoxemic respiratory failure,   The patient is on the cardiac monitor to evaluate for evidence of arrhythmia and/or significant heart rate changes.  MDM: This is a patient with significant dyspnea on exertion requiring new oxygen requirement with complete whiteout of the left lung field concerning for pleural effusion, should have it drained, admission due to severity of symptoms and new oxygen requirement.  For antibiotics at this time given just shortness of breath no white cell count now, no cough, no fever no obvious infection.  Patient's presentation is most consistent with acute presentation with potential threat to life or bodily function.       FINAL CLINICAL IMPRESSION(S) / ED DIAGNOSES   Final diagnoses:  Shortness of breath  Hypoxia  Pleural effusion     Rx / DC Orders   ED Discharge Orders     None        Note:  This document was prepared using Dragon voice recognition software and may include unintentional dictation errors.    Lucillie Garfinkel, MD 03/16/22 [phone]

## 2022-03-16 NOTE — H&P (Signed)
History and Physical    Patient: Kristin Brooks ASN:[id number] DOB: [birth year] DOA: 03/16/2022 DOS: the patient was seen and examined on 03/16/2022 PCP: Olin Hauser, DO  Patient coming from: Home  Chief Complaint:  Chief Complaint  Patient presents with   Shortness of Breath   HPI: Kristin Brooks is a 47 y.o. female with medical history significant primary sclerosing cholangitis.  As well as prior upper GI bleed with banding of esophageal varices.  However patient is generally ambulatory without any shortness of breath.  Patient has had a new symptom of exertional shortness of breath for the last 4 days which has been slightly progressive and got evaluated by PCP today.  Chest x-ray revealed left lung whiteout patient was sent to the ER.  There is no report of fever, cough, chest pain, leg swelling, cramping, presyncope.  No similar episodes in the past.   Review of Systems: As mentioned in the history of present illness. All other systems reviewed and are negative. History reviewed. No pertinent past medical history. History reviewed. No pertinent surgical history. Social History:  reports that she has been smoking cigarettes. She has a 0.30 pack-year smoking history. She has never used smokeless tobacco. She reports that she does not drink alcohol and does not use drugs.  No Known Allergies  Family History  Problem Relation Age of Onset   Heart disease Father     Prior to Admission medications   Medication Sig Start Date End Date Taking? Authorizing Provider  ondansetron (ZOFRAN) 4 MG tablet Take 4 mg by mouth every 8 (eight) hours as needed for nausea. 11/18/21  Yes [provider]  nadolol (CORGARD) 20 MG tablet Take 20 mg by mouth daily. Patient not taking: Reported on 03/16/2022 03/24/21 03/24/22  [provider]  pantoprazole (PROTONIX) 40 MG tablet Take 40 mg by mouth 2 (two) times daily. 03/24/21 03/24/22  [provider]    Physical  Exam: Vitals:   03/16/22 1955 03/16/22 2012 03/16/22 2104 03/16/22 2115  BP:   101/62   Pulse: (!) 102  94 94  Resp: (!) 22  20 16   Temp:  98.4 F (36.9 C)    TempSrc:  Oral    SpO2: 94%  92% 93%  Weight:      Height:       Patient is on 2 L/min of supplemental oxygen by nasal cannula, no distress. Respiratory exam: Reduced breath sounds diffusely in the left lung field with dullness to percussion.  Reduced vocal fremitus Cardiovascular exam S1-S2 normal Abdomen soft nontender Extremities without any edema. Neurologically patient is alert awake no focal motor deficit Data Reviewed: Results for orders placed or performed during the hospital encounter of 03/16/22 (from the past 24 hour(s))  Comprehensive metabolic panel     Status: Abnormal   Collection Time: 03/16/22  3:41 PM  Result Value Ref Range   Sodium 138 135 - 145 mmol/L   Potassium 3.5 3.5 - 5.1 mmol/L   Chloride 110 98 - 111 mmol/L   CO2 20 (L) 22 - 32 mmol/L   Glucose, Bld 99 70 - 99 mg/dL   BUN 17 6 - 20 mg/dL   Creatinine, Ser 0.59 0.44 - 1.00 mg/dL   Calcium 8.4 (L) 8.9 - 10.3 mg/dL   Total Protein 8.2 (H) 6.5 - 8.1 g/dL   Albumin 3.3 (L) 3.5 - 5.0 g/dL   AST 65 (H) 15 - 41 U/L   ALT 28 0 - 44 U/L  Alkaline Phosphatase 155 (H) 38 - 126 U/L   Total Bilirubin 1.0 0.3 - 1.2 mg/dL   GFR, Estimated >53 >97 mL/min   Anion gap 8 5 - 15  CBC with Differential     Status: Abnormal   Collection Time: 03/16/22  3:41 PM  Result Value Ref Range   WBC 3.9 (L) 4.0 - 10.5 K/uL   RBC 5.02 3.87 - 5.11 MIL/uL   Hemoglobin 14.2 12.0 - 15.0 g/dL   HCT 67.3 41.9 - 37.9 %   MCV 86.9 80.0 - 100.0 fL   MCH 28.3 26.0 - 34.0 pg   MCHC 32.6 30.0 - 36.0 g/dL   RDW 02.4 09.7 - 35.3 %   Platelets 69 (L) 150 - 400 K/uL   nRBC 0.0 0.0 - 0.2 %   Neutrophils Relative % 61 %   Neutro Abs 2.4 1.7 - 7.7 K/uL   Lymphocytes Relative 26 %   Lymphs Abs 1.0 0.7 - 4.0 K/uL   Monocytes Relative 5 %   Monocytes Absolute 0.2 0.1 - 1.0 K/uL    Eosinophils Relative 7 %   Eosinophils Absolute 0.3 0.0 - 0.5 K/uL   Basophils Relative 1 %   Basophils Absolute 0.0 0.0 - 0.1 K/uL   Immature Granulocytes 0 %   Abs Immature Granulocytes 0.01 0.00 - 0.07 K/uL  Troponin I (High Sensitivity)     Status: None   Collection Time: 03/16/22  3:41 PM  Result Value Ref Range   Troponin I (High Sensitivity) 2 <18 ng/L  Brain natriuretic peptide     Status: None   Collection Time: 03/16/22  3:41 PM  Result Value Ref Range   B Natriuretic Peptide 18.2 0.0 - 100.0 pg/mL  Resp panel by RT-PCR (RSV, Flu A&B, Covid) Anterior Nasal Swab     Status: None   Collection Time: 03/16/22  3:42 PM   Specimen: Anterior Nasal Swab  Result Value Ref Range   SARS Coronavirus 2 by RT PCR NEGATIVE NEGATIVE   Influenza A by PCR NEGATIVE NEGATIVE   Influenza B by PCR NEGATIVE NEGATIVE   Resp Syncytial Virus by PCR NEGATIVE NEGATIVE  Protime-INR     Status: Abnormal   Collection Time: 03/16/22  3:47 PM  Result Value Ref Range   Prothrombin Time 16.2 (H) 11.4 - 15.2 seconds   INR 1.3 (H) 0.8 - 1.2  Troponin I (High Sensitivity)     Status: None   Collection Time: 03/16/22  6:02 PM  Result Value Ref Range   Troponin I (High Sensitivity) 2 <18 ng/L   CLINICAL DATA:  Shortness of breath.   EXAM: CT CHEST WITHOUT CONTRAST   TECHNIQUE: Multidetector CT imaging of the chest was performed following the standard protocol without IV contrast.   RADIATION DOSE REDUCTION: This exam was performed according to the departmental dose-optimization program which includes automated exposure control, adjustment of the mA and/or kV according to patient size and/or use of iterative reconstruction technique.   COMPARISON:  None Available.   FINDINGS: Cardiovascular: No significant vascular findings. Normal heart size. A 5 mm thick posterolateral pericardial effusion is seen.   Mediastinum/Nodes: No enlarged mediastinal or axillary lymph nodes. Thyroid gland,  trachea, and esophagus demonstrate no significant findings.   Lungs/Pleura: Extensive amount of consolidation is noted throughout the left upper lobe and left lower lobe without evidence of aerated lung on the left.   Multiple small areas of atelectasis versus early infiltrate are seen within the right upper lobe.  A very large left pleural effusion is seen with subsequent mass effect on the heart and mediastinal structures.   A 7 mm pleural based noncalcified lung nodule is seen within the lateral aspect of the right lower lobe (axial CT image 110, CT series 3). 3 mm and 6 mm pleural based noncalcified lung nodule is seen within the posteromedial aspect of the right lower lobe (axial CT image 81, CT series 3).   A 2 mm anterolateral right upper lobe noncalcified lung nodule is seen (axial CT image 79, CT series 3).   No pneumothorax is identified.   Upper Abdomen: The liver is cirrhotic in appearance. The spleen is markedly enlarged.   A mild to moderate amount of abdominal ascites is seen within the upper abdomen.   The portal vein is markedly dilated and measures 3.4 cm x 3.0 cm in diameter. The splenic vein is also dilated.   Musculoskeletal: No chest wall mass or suspicious bone lesions identified.   IMPRESSION: 1. Extensive consolidation throughout the left upper lobe and left lower lobe, consistent with pneumonia. 2. Very large left pleural effusion. 3. Multiple small areas of right upper lobe atelectasis versus early infiltrate. 4. Multiple small pleural based noncalcified lung nodules within the right lung, as described above. No follow-up needed if patient is low-risk (and has no known or suspected primary neoplasm). Non-contrast chest CT can be considered in 12 months if patient is high-risk. This recommendation follows the consensus statement: Guidelines for Management of Incidental Pulmonary Nodules Detected on CT Images: From the Fleischner Society 2017;  Radiology 2017; 284:228-243. 5. 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 6. Mild to moderate amount of abdominal ascites.     Electronically Signed   By: Aram Candela M.D.   On: 03/16/2022 18:05    Assessment and Plan: * Pleural effusion If causing compressive atelectasis of the lung and hypoxia.  At this time would suggest that the patient receive a therapeutic thoracentesis.  Workup will be ordered accordingly I will also get lower extremity Dopplers to rule out venous thromboembolism.  I will also consider a possible pneumonia this immunocompromised patient discussed with patient  Pleural nodules Patient has a high smoking history.  Will refer to oncology.  Pneumonia This is a possible/probable diagnosis.  Because of patient's immunocompromise and presentation with pleural effusion.  We will treat the patient as community-acquired pneumonia with ceftriaxone and azithromycin.  Check cultures.  Ascites Start lasix.  Cytopenia Slightly atypical pattern for liver disease as the patient's hemoglobin is normal.  I will check B12 and folate  Elevated LFTs At this time I would attribute it to patient's known primary sclerosing cholangitis.  This may be worked up as an Office manager:   Code Status: Full Code   Consults: routine eval for hemonc.  Family Communication: husabd and bedsdie.  Severity of Illness: The appropriate patient status for this patient is INPATIENT. Inpatient status is judged to be reasonable and necessary in order to provide the required intensity of service to ensure the patient's safety. The patient's presenting symptoms, physical exam findings, and initial radiographic and laboratory data in the context of their chronic comorbidities is felt to place them at high risk for further clinical deterioration. Furthermore, it is not anticipated that the patient will be medically stable for discharge from the hospital  within 2 midnights of admission.   * I certify that at the point of admission it is my clinical judgment  that the patient will require inpatient hospital care spanning beyond 2 midnights from the point of admission due to high intensity of service, high risk for further deterioration and high frequency of surveillance required.*  Author: Gertie Fey, MD 03/16/2022 9:43 PM  For on call review [email or web address].

## 2022-03-16 NOTE — ED Notes (Signed)
First Nurse Note: Patient sent from [facility] for SOB. Patient had a abnormal chest x-ray.  94% RA 111 HR

## 2022-03-16 NOTE — Procedures (Addendum)
INDICATION: large left plerual effusion causing hypoxic resp failure. T.r.o empyema. PROCEDURE OPERATOR: Gertie Fey  Seizure: Large-volume thoracentesis of the left hemithorax  CONSENT: in charge     PROCEDURE SUMMARY:    A time out was performed and the chest x-ray and CAT scan was reviewed, the  appropriate side was confirmed ultrasound and marked.  A site in the left intra axillary region along the border of the pectoralis major muscle was taken.  My hands were washed  immediately prior to the procedure. I wore a surgical cap, mask with  protective eyewear, sterile gown and sterile gloves throughout the  procedure. The patient was prepped and draped in a sterile manner using  chlorhexidine scrub after the appropriate level was percussed and  confirmed by ultrasound.  Real Time ultrasound was used during this procedure.  A sterile sleeve was placed on the ultrasound probe.  A pocket of fluid was located about 3 cm deep to skin surface.  The pocket was at least 4 cm deep.  35ml 1% lidocaine was used to anesthesize the skin,  subcutaneous tissue, superior aspect of the rib periosteum and parietal  pleura. Blade scalpel was used to nick the  skin at the insertion site. The Safe-t-Centesis needle was then  introduced through the skin incision into the pleural space using  negative aspiration pressure and The thoracentesis catheter was  then threaded without difficulty. 1600 ml of straw colored fluid was removed  without difficulty at which time patient had cough. The catheter was then removed. No immediate  complications were noted during the procedure. A post-procedure chest  x-ray is pending at the time of this note. The fluid will be sent for  studies.  Estimated blood loss is negligible. Patient tolerated the procedure well. Dressing was applied.

## 2022-03-16 NOTE — ED Notes (Signed)
Pt unable to give urine sample at this time 

## 2022-03-16 NOTE — Assessment & Plan Note (Signed)
If causing compressive atelectasis of the lung and hypoxia.  At this time would suggest that the patient receive a therapeutic thoracentesis.  Workup will be ordered accordingly I will also get lower extremity Dopplers to rule out venous thromboembolism.  I will also consider a possible pneumonia this immunocompromised patient discussed with patient

## 2022-03-16 NOTE — ED Triage Notes (Signed)
Sob started on Saturday, pt went to dr Olin Pia office where she had an xray and was told that she had fluid on her left lung

## 2022-03-16 NOTE — ED Provider Triage Note (Signed)
Emergency Medicine Provider Triage Evaluation Note  Kristin Brooks , a 47 y.o. female  was evaluated in triage.  Pt complains of shortness of breath. No HX copd/chf. Patient with no significant cough or URI symptoms. Xray at [facility] clinic showed "fluid on lungs.".  Review of Systems  Positive: Shortness of breath Negative: CP, fever, cough, URI symptoms  Physical Exam  BP 137/84 (BP Location: Right Arm)   Pulse (!) 117   Temp 97.8 F (36.6 C) (Oral)   Resp 20   Ht 6' (1.829 m)   Wt 102.1 kg   SpO2 (!) 87%   BMI 30.52 kg/m  Gen:   Awake, no distress   Resp:  Normal effort  MSK:   Moves extremities without difficulty  Other:    Medical Decision Making  Medically screening exam initiated at 3:47 PM.  Appropriate orders placed.  Kristin Brooks was informed that the remainder of the evaluation will be completed by another provider, this initial triage assessment does not replace that evaluation, and the importance of remaining in the ED until their evaluation is complete.  Labs, EKG, chest xray. Patient hypoxic and placed on O2   Darletta Moll, PA-C 03/16/22 1547

## 2022-03-16 NOTE — ED Notes (Signed)
Hospitalist at bedside to perform thoracentesis. Consent obtained in patients chart.

## 2022-03-16 NOTE — ED Notes (Signed)
Only able to obtain one set of blood cultures. MD aware.

## 2022-03-16 NOTE — Assessment & Plan Note (Signed)
This is a possible/probable diagnosis.  Because of patient's immunocompromise and presentation with pleural effusion.  We will treat the patient as community-acquired pneumonia with ceftriaxone and azithromycin.  Check cultures.

## 2022-03-16 NOTE — Assessment & Plan Note (Signed)
At this time I would attribute it to patient's known primary sclerosing cholangitis.  This may be worked up as an outpatient

## 2022-03-16 NOTE — Assessment & Plan Note (Signed)
Patient has a high smoking history.  Will refer to oncology.

## 2022-03-16 NOTE — Assessment & Plan Note (Signed)
Start lasix.

## 2022-03-17 ENCOUNTER — Encounter: Payer: Self-pay | Admitting: Internal Medicine

## 2022-03-17 ENCOUNTER — Inpatient Hospital Stay: Payer: BC Managed Care – PPO

## 2022-03-17 DIAGNOSIS — K743 Primary biliary cirrhosis: Secondary | ICD-10-CM | POA: Insufficient documentation

## 2022-03-17 DIAGNOSIS — J90 Pleural effusion, not elsewhere classified: Secondary | ICD-10-CM | POA: Diagnosis not present

## 2022-03-17 DIAGNOSIS — K7460 Unspecified cirrhosis of liver: Secondary | ICD-10-CM | POA: Insufficient documentation

## 2022-03-17 DIAGNOSIS — Z8719 Personal history of other diseases of the digestive system: Secondary | ICD-10-CM

## 2022-03-17 DIAGNOSIS — K7581 Nonalcoholic steatohepatitis (NASH): Secondary | ICD-10-CM | POA: Insufficient documentation

## 2022-03-17 DIAGNOSIS — I8510 Secondary esophageal varices without bleeding: Secondary | ICD-10-CM | POA: Insufficient documentation

## 2022-03-17 LAB — CBC
HCT: 38.3 % (ref 36.0–46.0)
Hemoglobin: 12.5 g/dL (ref 12.0–15.0)
MCH: 28.4 pg (ref 26.0–34.0)
MCHC: 32.6 g/dL (ref 30.0–36.0)
MCV: 87 fL (ref 80.0–100.0)
Platelets: 51 10*3/uL — ABNORMAL LOW (ref 150–400)
RBC: 4.4 MIL/uL (ref 3.87–5.11)
RDW: 15.2 % (ref 11.5–15.5)
WBC: 3.1 10*3/uL — ABNORMAL LOW (ref 4.0–10.5)
nRBC: 0 % (ref 0.0–0.2)

## 2022-03-17 LAB — COMPREHENSIVE METABOLIC PANEL
ALT: 26 U/L (ref 0–44)
AST: 57 U/L — ABNORMAL HIGH (ref 15–41)
Albumin: 2.8 g/dL — ABNORMAL LOW (ref 3.5–5.0)
Alkaline Phosphatase: 141 U/L — ABNORMAL HIGH (ref 38–126)
Anion gap: 7 (ref 5–15)
BUN: 13 mg/dL (ref 6–20)
CO2: 20 mmol/L — ABNORMAL LOW (ref 22–32)
Calcium: 8 mg/dL — ABNORMAL LOW (ref 8.9–10.3)
Chloride: 110 mmol/L (ref 98–111)
Creatinine, Ser: 0.57 mg/dL (ref 0.44–1.00)
GFR, Estimated: >60 mL/min (ref >60)
Glucose, Bld: 110 mg/dL — ABNORMAL HIGH (ref 70–99)
Potassium: 3.7 mmol/L (ref 3.5–5.1)
Sodium: 137 mmol/L (ref 135–145)
Total Bilirubin: 1.2 mg/dL (ref 0.3–1.2)
Total Protein: 7.2 g/dL (ref 6.5–8.1)

## 2022-03-17 LAB — FOLATE: Folate: 8.8 ng/mL (ref >5.9)

## 2022-03-17 LAB — LACTATE DEHYDROGENASE: LDH: 171 U/L (ref 98–192)

## 2022-03-17 LAB — PREGNANCY, URINE: Preg Test, Ur: NEGATIVE

## 2022-03-17 LAB — APTT: aPTT: 34 seconds (ref 24–36)

## 2022-03-17 LAB — VITAMIN B12: Vitamin B-12: 694 pg/mL (ref 180–914)

## 2022-03-17 MED ORDER — SPIRONOLACTONE 25 MG PO TABS
25.0000 mg | ORAL_TABLET | Freq: Every day | ORAL | Status: DC
  Administered 2022-03-17: 25 mg via ORAL
  Filled 2022-03-17: qty 1

## 2022-03-17 MED ORDER — FUROSEMIDE 20 MG PO TABS
20.0000 mg | ORAL_TABLET | Freq: Every day | ORAL | Status: DC
  Administered 2022-03-18: 20 mg via ORAL
  Filled 2022-03-17: qty 1

## 2022-03-17 NOTE — Progress Notes (Signed)
PROGRESS NOTE    Kristin Brooks   MEQ:[id number] DOB: Nov 15, 1975  DOA: 03/16/2022 Date of Service: 03/17/22 PCP: Olin Hauser, DO     Brief Narrative / Hospital Course:  Kristin Brooks is a 47 y.o. 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 Patient is generally ambulatory without any shortness of breath until new SOB/DOE for the last 4 days which has been slightly progressive and got evaluated by PCP 03/16/22.  Chest x-ray revealed left lung whiteout patient was sent to the ER.  01/10: in ED, SpO2 87% on room air, improved to 94% on 2L [place].  Tachycardic to 117 bpm, tachypneic to RR max 28, afebrile and BP WNL.  CBC and CMP largely unremarkable other than low WBC at 3.9, slight elevation AST and alk phos, slight low CO2 to 20.  Negative for influenza/COVID.  BNP low.  CXR confirmed complete opacification left, CT chest (+)extensive consolidation throughout the left upper lobe and left lower lobe, consistent with pneumonia; very large left pleural effusion, multiple lung nodules, hepatic cirrhosis with evidence of portal hypertension, mild to moderate amount of abdominal ascites. No DVT on [place]. Underwent bedside thoracentesis w/ removal 1600 mL fluid.  01/11: Postprocedure CXR some improvement, pt reports significant improvement in SOB. Started lasix/spiro. Curbside to GI on call - pt needs to follow w/ outpatient team at [facility] given need for transplant. No concerns to warrant urgent consult at this time. Will monitor overnight, check labs in AM and repeat CXR  Consultants:  none  Procedures: 03/16/22 L thoracentesis, removal 1600 mL fluid      ASSESSMENT & PLAN:   Principal Problem:   Pleural effusion Active Problems:   Elevated LFTs   Cytopenia   Ascites   Pneumonia   Acute respiratory failure with hypoxia (HCC)   Pleural  nodules   Hepatic cirrhosis (HCC)   Esophageal varices in cirrhosis (HCC)   History of esophageal varices with bleeding   Primary biliary cholangitis (HCC)   NASH (nonalcoholic steatohepatitis)    Left Pleural effusion s/p thoracentesis Pleural effusion likely due to cirrhosis/portal HTN, complicated potentially by pneumonia Community-acquired pneumonia/compressive atelectasis leading to pneumonia, concern for potential immune compromise with mild leukopenia Acute respiratory failure with hypoxia due to above Supplemental O2 as needed --> has been on RA  Repeat CXR in AM Low suspicion for PNA, have d/c abx, started IC   Hepatic cirrhosis ([place]) due to primary biliary cholangitis/NASH Esophageal varices in cirrhosis ([place]) History of esophageal varices with bleeding Primary biliary cholangitis (HCC) NASH (nonalcoholic steatohepatitis) Elevated LFTs - mild/stable Ascites Portal hypertension Reviewed most recent GI notes from outpatient visit 07/15/2021, follows at [facility] clinic w/ Dr Chauncey Mann Per med history, patient has not been taking nadolol Continuing pantoprazole, ondansetron Patient started on Lasix, Spironolactone      DVT prophylaxis: ambulatory  Pertinent IV fluids/nutrition: no IV fluids Central lines / invasive devices: none  Code Status: FULL CODE   Current Admission Status: inpatient   TOC needs / Dispo plan: none Barriers to discharge / significant pending items: if CXR and resp status / labs ok anticipate early AM d/c home tm              Subjective / Brief ROS:  Patient reports improvement today Denies CP/SOB.  Pain controlled.  Denies new weakness.  Tolerating diet.  Reports no concerns w/ urination/defecation.   Family Communication: husband  at bedside on rounds    Objective Findings:  Vitals:   03/17/22 0100 03/17/22 0130 03/17/22 0200 03/17/22 0722  BP: 118/73 112/65 117/74   Pulse: 94 95 90   Resp: (!) 21 (!) 22 19   Temp:     98.3 F (36.8 C)  TempSrc:    Oral  SpO2: 93% 94% 92%   Weight:      Height:        Intake/Output Summary (Last 24 hours) at 03/17/2022 0819 Last data filed at 03/16/2022 2104 Gross per 24 hour  Intake 355.49 ml  Output --  Net 355.49 ml   Filed Weights   03/16/22 1542  Weight: 102.1 kg    Examination:  Physical Exam Constitutional:      General: She is not in acute distress.    Appearance: She is well-developed.  Cardiovascular:     Rate and Rhythm: Normal rate and regular rhythm.  Pulmonary:     Effort: Pulmonary effort is normal. No tachypnea, accessory muscle usage or respiratory distress.     Breath sounds: Examination of the left-middle field reveals decreased breath sounds and rales. Examination of the left-lower field reveals decreased breath sounds and rales. Decreased breath sounds and rales present.  Abdominal:     Palpations: Abdomen is soft. There is no hepatomegaly or mass.     Tenderness: There is no rebound.  Musculoskeletal:     Right lower leg: No edema.     Left lower leg: No edema.  Skin:    General: Skin is warm and dry.  Neurological:     General: No focal deficit present.     Mental Status: She is alert and oriented to person, place, and time.  Psychiatric:        Mood and Affect: Mood normal.        Behavior: Behavior normal.          Scheduled Medications:   furosemide  40 mg Oral Daily   pantoprazole  40 mg Oral BID    Continuous Infusions:  azithromycin Stopped (03/16/22 2104)   cefTRIAXone (ROCEPHIN)  IV Stopped (03/16/22 2104)    PRN Medications:  acetaminophen **OR** acetaminophen, ondansetron  Antimicrobials from admission:  Anti-infectives (From admission, onward)    Start     Dose/Rate Route Frequency Ordered Stop   03/16/22 1900  cefTRIAXone (ROCEPHIN) 2 g in sodium chloride 0.9 % 100 mL IVPB        2 g 200 mL/hr over 30 Minutes Intravenous Every 24 hours 03/16/22 1845     03/16/22 1900  azithromycin (ZITHROMAX)  500 mg in sodium chloride 0.9 % 250 mL IVPB        500 mg 250 mL/hr over 60 Minutes Intravenous Every 24 hours 03/16/22 1845             Data Reviewed:  I have personally reviewed the following...  CBC: Recent Labs  Lab 03/16/22 1541 03/17/22 0601  WBC 3.9* 3.1*  NEUTROABS 2.4  --   HGB 14.2 12.5  HCT 43.6 38.3  MCV 86.9 87.0  PLT 69* 51*   Basic Metabolic Panel: Recent Labs  Lab 03/16/22 1541 03/17/22 0601  NA 138 137  K 3.5 3.7  CL 110 110  CO2 20* 20*  GLUCOSE 99 110*  BUN 17 13  CREATININE 0.59 0.57  CALCIUM 8.4* 8.0*   GFR: Estimated Creatinine Clearance: 117.5 mL/min (by C-G formula based on SCr of 0.57 mg/dL). Liver Function Tests: Recent Labs  Lab 03/16/22 1541 03/17/22 0601  AST 65* 57*  ALT 28 26  ALKPHOS 155* 141*  BILITOT 1.0 1.2  PROT 8.2* 7.2  ALBUMIN 3.3* 2.8*   No results for input(s): "LIPASE", "AMYLASE" in the last 168 hours. No results for input(s): "AMMONIA" in the last 168 hours. Coagulation Profile: Recent Labs  Lab 03/16/22 1547  INR 1.3*   Cardiac Enzymes: No results for input(s): "CKTOTAL", "CKMB", "CKMBINDEX", "TROPONINI" in the last 168 hours. BNP (last 3 results) No results for input(s): "PROBNP" in the last 8760 hours. HbA1C: No results for input(s): "HGBA1C" in the last 72 hours. CBG: No results for input(s): "GLUCAP" in the last 168 hours. Lipid Profile: No results for input(s): "CHOL", "HDL", "LDLCALC", "TRIG", "CHOLHDL", "LDLDIRECT" in the last 72 hours. Thyroid Function Tests: No results for input(s): "TSH", "T4TOTAL", "FREET4", "T3FREE", "THYROIDAB" in the last 72 hours. Anemia Panel: Recent Labs    03/17/22 0601  FOLATE 8.8   Most Recent Urinalysis On File:  No results found for: "COLORURINE", "APPEARANCEUR", "LABSPEC", "PHURINE", "GLUCOSEU", "HGBUR", "BILIRUBINUR", "KETONESUR", "PROTEINUR", "UROBILINOGEN", "NITRITE", "LEUKOCYTESUR" Sepsis  Labs: @LABRCNTIP (procalcitonin:4,lacticidven:4) Microbiology: Recent Results (from the past 240 hour(s))  Resp panel by RT-PCR (RSV, Flu A&B, Covid) Anterior Nasal Swab     Status: None   Collection Time: 03/16/22  3:42 PM   Specimen: Anterior Nasal Swab  Result Value Ref Range Status   SARS Coronavirus 2 by RT PCR NEGATIVE NEGATIVE Final    Comment: (NOTE) SARS-CoV-2 target nucleic acids are NOT DETECTED.  The SARS-CoV-2 RNA is generally detectable in upper respiratory specimens during the acute phase of infection. The lowest concentration of SARS-CoV-2 viral copies this assay can detect is 138 copies/mL. A negative result does not preclude SARS-Cov-2 infection and should not be used as the sole basis for treatment or other patient management decisions. A negative result may occur with  improper specimen collection/handling, submission of specimen other than nasopharyngeal swab, presence of viral mutation(s) within the areas targeted by this assay, and inadequate number of viral copies(<138 copies/mL). A negative result must be combined with clinical observations, patient history, and epidemiological information. The expected result is Negative.  Fact Sheet for Patients:  05/15/22  Fact Sheet for Healthcare Providers:  BloggerCourse.com  This test is no t yet approved or cleared by the [place] FDA and  has been authorized for detection and/or diagnosis of SARS-CoV-2 by FDA under an Emergency Use Authorization (EUA). This EUA will remain  in effect (meaning this test can be used) for the duration of the COVID-19 declaration under Section 564(b)(1) of the Act, 21 U.S.C.section 360bbb-3(b)(1), unless the authorization is terminated  or revoked sooner.       Influenza A by PCR NEGATIVE NEGATIVE Final   Influenza B by PCR NEGATIVE NEGATIVE Final    Comment: (NOTE) The Xpert Xpress SARS-CoV-2/FLU/RSV plus assay is  intended as an aid in the diagnosis of influenza from Nasopharyngeal swab specimens and should not be used as a sole basis for treatment. Nasal washings and aspirates are unacceptable for Xpert Xpress SARS-CoV-2/FLU/RSV testing.  Fact Sheet for Patients: [place]  Fact Sheet for Healthcare Providers: BloggerCourse.com  This test is not yet approved or cleared by the [place] FDA and has been authorized for detection and/or diagnosis of SARS-CoV-2 by FDA under an Emergency Use Authorization (EUA). This EUA will remain in effect (meaning this test can be used) for the duration of the COVID-19 declaration under Section 564(b)(1) of the Act, 21 U.S.C. section 360bbb-3(b)(1), unless  the authorization is terminated or revoked.     Resp Syncytial Virus by PCR NEGATIVE NEGATIVE Final    Comment: (NOTE) Fact Sheet for Patients: BloggerCourse.com  Fact Sheet for Healthcare Providers: SeriousBroker.it  This test is not yet approved or cleared by the [place] FDA and has been authorized for detection and/or diagnosis of SARS-CoV-2 by FDA under an Emergency Use Authorization (EUA). This EUA will remain in effect (meaning this test can be used) for the duration of the COVID-19 declaration under Section 564(b)(1) of the Act, 21 U.S.C. section 360bbb-3(b)(1), unless the authorization is terminated or revoked.  Performed at [facility], [address] [place], [place] [address]   Culture, blood (Routine X 2) w Reflex to ID Panel     Status: None (Preliminary result)   Collection Time: 03/16/22  7:24 PM   Specimen: BLOOD  Result Value Ref Range Status   Specimen Description BLOOD LEFTAC  Final   Special Requests   Final    BOTTLES DRAWN AEROBIC AND ANAEROBIC Blood Culture adequate volume   Culture   Final    NO GROWTH < 12 HOURS Performed at [facility] [address], [place], [place] [address]    Report Status PENDING  Incomplete  Body fluid culture w Gram Stain     Status: None (Preliminary result)   Collection Time: 03/16/22  9:18 PM   Specimen: Pleura; Body Fluid  Result Value Ref Range Status   Specimen Description   Final    PLEURAL Performed at [facility], [address], [place], [place] [address]    Special Requests   Final    NONE Performed at [facility], [address] [place], [place] [address]    Gram Stain   Final    NO WBC SEEN NO ORGANISMS SEEN Performed at [facility], [address], [place], [place] [address]    Culture PENDING  Incomplete   Report Status PENDING  Incomplete      Radiology Studies last 3 days: US Venous Img Lower Bilateral (DVT)  Result Date: 03/17/2022 CLINICAL DATA:  Swelling EXAM: BILATERAL LOWER EXTREMITY VENOUS DOPPLER ULTRASOUND TECHNIQUE: Gray-scale sonography with graded compression, as well as color Doppler and duplex ultrasound were performed to evaluate the lower extremity deep venous systems from the level of the common femoral vein and including the common femoral, femoral, profunda femoral, popliteal and calf veins including the posterior tibial, peroneal and gastrocnemius veins when visible. The superficial great saphenous vein was also interrogated. Spectral Doppler was utilized to evaluate flow at rest and with distal augmentation maneuvers in the common femoral, femoral and popliteal veins. COMPARISON:  None Available. FINDINGS: RIGHT LOWER EXTREMITY Common Femoral Vein: No evidence of thrombus. Normal compressibility, respiratory phasicity and response to augmentation. Saphenofemoral Junction: No evidence of thrombus. Normal compressibility and flow on color Doppler imaging. Profunda Femoral Vein: No evidence of thrombus. Normal compressibility and flow on color Doppler imaging. Femoral Vein: No evidence of thrombus. Normal compressibility,  respiratory phasicity and response to augmentation. Popliteal Vein: No evidence of thrombus. Normal compressibility, respiratory phasicity and response to augmentation. Calf Veins: No evidence of thrombus. Normal compressibility and flow on color Doppler imaging. Superficial Great Saphenous Vein: No evidence of thrombus. Normal compressibility. Venous Reflux:  None. Other Findings:  None. LEFT LOWER EXTREMITY Common Femoral Vein: No evidence of thrombus. Normal compressibility, respiratory phasicity and response to augmentation. Saphenofemoral Junction: No evidence of thrombus. Normal compressibility and flow on color  Doppler imaging. Profunda Femoral Vein: No evidence of thrombus. Normal compressibility and flow on color Doppler imaging. Femoral Vein: No evidence of thrombus. Normal compressibility, respiratory phasicity and response to augmentation. Popliteal Vein: No evidence of thrombus. Normal compressibility, respiratory phasicity and response to augmentation. Calf Veins: No evidence of thrombus. Normal compressibility and flow on color Doppler imaging. Superficial Great Saphenous Vein: No evidence of thrombus. Normal compressibility. Venous Reflux:  None. Other Findings:  None. IMPRESSION: No evidence of deep venous thrombosis in either lower extremity. Electronically Signed   By: Layla Maw M.D.   On: 03/17/2022 01:39   DG Chest Port 1 View  Result Date: 03/16/2022 CLINICAL DATA:  Post procedural discomfort EXAM: PORTABLE CHEST 1 VIEW COMPARISON:  03/16/2022 at 3:55 p.m. FINDINGS: No significant change from radiographs earlier today. Slight leftward mediastinal shift. Complete opacification of the left hemithorax. Mild interstitial thickening in the right lung. IMPRESSION: Complete opacification of the left hemithorax, unchanged. This was due to a large left pleural effusion and atelectasis on CT 03/16/2022 Electronically Signed   By: Minerva Fester M.D.   On: 03/16/2022 21:36   CT CHEST WO  CONTRAST  Result Date: 03/16/2022 CLINICAL DATA:  Shortness of breath. EXAM: CT CHEST WITHOUT CONTRAST TECHNIQUE: Multidetector CT imaging of the chest was performed following the standard protocol without IV contrast. RADIATION DOSE REDUCTION: This exam was performed according to the departmental dose-optimization program which includes automated exposure control, adjustment of the mA and/or kV according to patient size and/or use of iterative reconstruction technique. COMPARISON:  None Available. FINDINGS: Cardiovascular: No significant vascular findings. Normal heart size. A 5 mm thick posterolateral pericardial effusion is seen. Mediastinum/Nodes: No enlarged mediastinal or axillary lymph nodes. Thyroid gland, trachea, and esophagus demonstrate no significant findings. Lungs/Pleura: Extensive amount of consolidation is noted throughout the left upper lobe and left lower lobe without evidence of aerated lung on the left. Multiple small areas of atelectasis versus early infiltrate are seen within the right upper lobe. A very large left pleural effusion is seen with subsequent mass effect on the heart and mediastinal structures. A 7 mm pleural based noncalcified lung nodule is seen within the lateral aspect of the right lower lobe (axial CT image 110, CT series 3). 3 mm and 6 mm pleural based noncalcified lung nodule is seen within the posteromedial aspect of the right lower lobe (axial CT image 81, CT series 3). A 2 mm anterolateral right upper lobe noncalcified lung nodule is seen (axial CT image 79, CT series 3). No pneumothorax is identified. Upper Abdomen: The liver is cirrhotic in appearance. The spleen is markedly enlarged. A mild to moderate amount of abdominal ascites is seen within the upper abdomen. The portal vein is markedly dilated and measures 3.4 cm x 3.0 cm in diameter. The splenic vein is also dilated. Musculoskeletal: No chest wall mass or suspicious bone lesions identified. IMPRESSION: 1.  Extensive consolidation throughout the left upper lobe and left lower lobe, consistent with pneumonia. 2. Very large left pleural effusion. 3. Multiple small areas of right upper lobe atelectasis versus early infiltrate. 4. Multiple small pleural based noncalcified lung nodules within the right lung, as described above. No follow-up needed if patient is low-risk (and has no known or suspected primary neoplasm). Non-contrast chest CT can be considered in 12 months if patient is high-risk. This recommendation follows the consensus statement: Guidelines for Management of Incidental Pulmonary Nodules Detected on CT Images: From the Fleischner Society 2017; Radiology 2017; 284:228-243. 5. Hepatic cirrhosis  with evidence of portal hypertension. 6. Mild to moderate amount of abdominal ascites. Electronically Signed   By: Virgina Norfolk M.D.   On: 03/16/2022 18:05   DG Chest 2 View  Result Date: 03/16/2022 CLINICAL DATA:  Shortness of breath EXAM: CHEST - 2 VIEW COMPARISON:  None Available. FINDINGS: There is complete opacification of the left hemithorax. The right lung is clear. Cardiomediastinal silhouette is obscured by left-sided opacities, but is otherwise within normal limits on the right. No pneumothorax. The osseous structures are within normal limits. IMPRESSION: Complete opacification of the left hemithorax, likely a combination of pleural effusion and airspace disease. Other pathology not excluded. Electronically Signed   By: Ronney Asters M.D.   On: 03/16/2022 16:07             LOS: 1 day       Emeterio Reeve, DO [facility] 03/17/2022, 8:19 AM    Dictation software may have been used to generate the above note. Typos may occur and escape review in typed/dictated notes. Please contact Dr Sheppard Coil directly for clarity if needed.  Staff may message me via secure chat in [place]  but this may not receive an immediate response,  please page me for urgent matters!  If  7PM-7AM, please contact night coverage [email or web address]

## 2022-03-17 NOTE — Hospital Course (Addendum)
Kristin Brooks is a 47 y.o. 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 Patient is generally ambulatory without any shortness of breath until new SOB/DOE for the last 4 days which has been slightly progressive and got evaluated by PCP 03/16/22.  Chest x-ray revealed left lung whiteout patient was sent to the ER.  01/10: in ED, SpO2 87% on room air, improved to 94% on 2L [place].  Tachycardic to 117 bpm, tachypneic to RR max 28, afebrile and BP WNL.  CBC and CMP largely unremarkable other than low WBC at 3.9, slight elevation AST and alk phos, slight low CO2 to 20.  Negative for influenza/COVID.  BNP low.  CXR confirmed complete opacification left, CT chest (+)extensive consolidation throughout the left upper lobe and left lower lobe, consistent with pneumonia; very large left pleural effusion, multiple lung nodules, hepatic cirrhosis with evidence of portal hypertension, mild to moderate amount of abdominal ascites. No DVT on [place]. Underwent bedside thoracentesis w/ removal 1600 mL fluid.  01/11: Postprocedure CXR ***  Consultants:  ***  Procedures: 03/16/22 L thoracentesis, removal 1600 mL fluid      ASSESSMENT & PLAN:   Principal Problem:   Pleural effusion Active Problems:   Elevated LFTs   Cytopenia   Ascites   Pneumonia   Acute respiratory failure with hypoxia (HCC)   Pleural nodules   Hepatic cirrhosis (HCC)   Esophageal varices in cirrhosis (HCC)   History of esophageal varices with bleeding   Primary biliary cholangitis (HCC)   NASH (nonalcoholic steatohepatitis)    Left Pleural effusion s/p thoracentesis Transudative Pleural effusion likely due to cirrhosis/portal HTN, complicated potentially by pneumonia but this seems less likely Community-acquired pneumonia/compressive atelectasis leading to pneumonia, concern for potential  immune compromise with mild leukopenia Acute respiratory failure with hypoxia due to above Supplemental O2 as needed Repeat CXR shows some improvement, pt reports significant improvement  Continue ceftriaxone and azithromycin, today 03/17/2022 is day 2 antibiotics  Hepatic cirrhosis (HCC) due to primary biliary cholangitis/NASH Esophageal varices in cirrhosis (HCC) History of esophageal varices with bleeding Primary biliary cholangitis (HCC) NASH (nonalcoholic steatohepatitis) Elevated LFTs - mild/stable Ascites Portal hypertension Reviewed most recent GI notes from outpatient visit 07/15/2021, follows at [facility] clinic w/ Dr Chauncey Mann Per med history, patient has not been taking nadolol Continuing pantoprazole, ondansetron Patient started on Lasix     DVT prophylaxis: *** Pertinent IV fluids/nutrition: *** Central lines / invasive devices: ***  Code Status: ***  Current Admission Status: ***  TOC needs / Dispo plan: *** Barriers to discharge / significant pending items: ***

## 2022-03-18 ENCOUNTER — Other Ambulatory Visit (HOSPITAL_BASED_OUTPATIENT_CLINIC_OR_DEPARTMENT_OTHER): Payer: Self-pay | Admitting: Osteopathic Medicine

## 2022-03-18 ENCOUNTER — Other Ambulatory Visit: Payer: Self-pay | Admitting: Internal Medicine

## 2022-03-18 ENCOUNTER — Inpatient Hospital Stay: Payer: BC Managed Care – PPO

## 2022-03-18 DIAGNOSIS — J90 Pleural effusion, not elsewhere classified: Secondary | ICD-10-CM | POA: Diagnosis not present

## 2022-03-18 LAB — BASIC METABOLIC PANEL
Anion gap: 6 (ref 5–15)
BUN: 10 mg/dL (ref 6–20)
CO2: 20 mmol/L — ABNORMAL LOW (ref 22–32)
Calcium: 7.7 mg/dL — ABNORMAL LOW (ref 8.9–10.3)
Chloride: 111 mmol/L (ref 98–111)
Creatinine, Ser: 0.6 mg/dL (ref 0.44–1.00)
GFR, Estimated: >60 mL/min (ref >60)
Glucose, Bld: 125 mg/dL — ABNORMAL HIGH (ref 70–99)
Potassium: 3.4 mmol/L — ABNORMAL LOW (ref 3.5–5.1)
Sodium: 137 mmol/L (ref 135–145)

## 2022-03-18 LAB — CYTOLOGY - NON PAP

## 2022-03-18 LAB — TRIGLYCERIDES, BODY FLUIDS: Triglycerides, Fluid: 22 mg/dL

## 2022-03-18 LAB — HIV ANTIBODY (ROUTINE TESTING W REFLEX): HIV Screen 4th Generation wRfx: NONREACTIVE

## 2022-03-18 MED ORDER — LIDOCAINE HCL (PF) 1 % IJ SOLN
10.0000 mL | Freq: Once | INTRAMUSCULAR | Status: AC
  Administered 2022-03-18: 10 mL via INTRADERMAL

## 2022-03-18 MED ORDER — FUROSEMIDE 20 MG PO TABS: 20.0000 mg | ORAL_TABLET | Freq: Every day | ORAL | 0 refills | Status: AC

## 2022-03-18 MED ORDER — SPIRONOLACTONE 25 MG PO TABS: 25.0000 mg | ORAL_TABLET | Freq: Every day | ORAL | 0 refills | Status: AC

## 2022-03-18 NOTE — Progress Notes (Signed)
error 

## 2022-03-18 NOTE — Procedures (Signed)
PROCEDURE SUMMARY:  Successful US guided therapeutic left thoracentesis. Yielded 1.6 L of clear, amber fluid. Pt tolerated procedure well. No immediate complications.  Specimen not sent for labs. CXR ordered.  EBL < 1 mL  Tyson Alias, AGNP 03/18/2022 2:43 PM

## 2022-03-18 NOTE — Plan of Care (Signed)
  Problem: Education: ?Goal: Knowledge of General Education information will improve ?Description: Including pain rating scale, medication(s)/side effects and non-pharmacologic comfort measures ?Outcome: Progressing ?  ?Problem: Clinical Measurements: ?Goal: Will remain free from infection ?Outcome: Progressing ?Goal: Respiratory complications will improve ?Outcome: Progressing ?  ?Problem: Activity: ?Goal: Risk for activity intolerance will decrease ?Outcome: Progressing ?  ?Problem: Nutrition: ?Goal: Adequate nutrition will be maintained ?Outcome: Progressing ?  ?Problem: Coping: ?Goal: Level of anxiety will decrease ?Outcome: Progressing ?  ?Problem: Elimination: ?Goal: Will not experience complications related to bowel motility ?Outcome: Progressing ?  ?Problem: Pain Managment: ?Goal: General experience of comfort will improve ?Outcome: Progressing ?  ?Problem: Safety: ?Goal: Ability to remain free from injury will improve ?Outcome: Progressing ?  ?Problem: Skin Integrity: ?Goal: Risk for impaired skin integrity will decrease ?Outcome: Progressing ?  ?

## 2022-03-18 NOTE — Discharge Summary (Signed)
Physician Discharge Summary   Patient: Kristin Brooks MRN: [id number]  DOB: 10-Oct-1975   Admit:     Date of Admission: 03/16/2022 Admitted from: home   Discharge: Date of discharge: 03/18/22 Disposition: Home Condition at discharge: good  CODE STATUS: FULL CODE     Discharge Physician: Emeterio Reeve, DO [facility]     PCP: Olin Hauser, DO  Recommendations for Outpatient Follow-up:  Follow up with PCP Parks Ranger, Devonne Doughty, DO in 1 weeks Please obtain labs/tests: BMP, CBC, hepatic panel in 1 week Please follow up on the following pending results: Pleural fluid culture though labs consistent with transudate, unlikely infection PCP AND OTHER OUTPATIENT PROVIDERS: SEE BELOW FOR SPECIFIC DISCHARGE INSTRUCTIONS PRINTED FOR PATIENT IN ADDITION TO GENERIC AVS PATIENT INFO    Discharge Instructions     Diet - low sodium heart healthy   Complete by: As directed    Discharge instructions   Complete by: As directed    Please keep follow-up appointment with your primary care physician next week.  They will need to recheck blood work given that we are starting a new medication with furosemide and spironolactone.  They should be helpful in arranging appropriate follow-up with liver team at [facility].   Increase activity slowly   Complete by: As directed          Discharge Diagnoses: Principal Problem:   Pleural effusion Active Problems:   Elevated LFTs   Cytopenia   Ascites   Pneumonia   Acute respiratory failure with hypoxia (HCC)   Pleural nodules   Hepatic cirrhosis (HCC)   Esophageal varices in cirrhosis ([place])   History of esophageal varices with bleeding   Primary biliary cholangitis (HCC)   NASH (nonalcoholic steatohepatitis)       Hospital Course: Kristin Brooks is a 47 y.o. 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 Patient is generally ambulatory without any shortness of breath until new SOB/DOE for the last 4 days which has been slightly progressive and got evaluated by PCP 03/16/22.  Chest x-ray revealed left lung whiteout patient was sent to the ER.  01/10: in ED, SpO2 87% on room air, improved to 94% on 2L [place].  Tachycardic to 117 bpm, tachypneic to RR max 28, afebrile and BP WNL.  CBC and CMP largely unremarkable other than low WBC at 3.9, slight elevation AST and alk phos, slight low CO2 to 20.  Negative for influenza/COVID.  BNP low.  CXR confirmed complete opacification left, CT chest (+)extensive consolidation throughout the left upper lobe and left lower lobe, consistent with pneumonia; very large left pleural effusion, multiple lung nodules, hepatic cirrhosis with evidence of portal hypertension, mild to moderate amount of abdominal ascites. No DVT on [place]. Underwent bedside thoracentesis w/ removal 1600 mL fluid.  01/11: Postprocedure CXR some improvement, pt reports significant improvement in SOB. Started lasix/spiro. Curbside to GI on call - pt needs to follow w/ outpatient team at [facility] given need for transplant. No concerns to warrant urgent consult at this time. Will monitor overnight, check labs in AM and repeat CXR   Consultants:  none   Procedures: 03/16/22 L thoracentesis, removal 1600 mL fluid 03/18/22 US guided therapeutic left thoracentesis yielded 1.6 L of clear, amber fluid.           ASSESSMENT & PLAN:   Principal Problem:   Pleural effusion Active Problems:  Elevated LFTs   Cytopenia   Ascites   Pneumonia   Acute respiratory failure with hypoxia (HCC)   Pleural nodules   Hepatic cirrhosis (HCC)   Esophageal varices in cirrhosis (HCC)   History of esophageal varices with bleeding   Primary biliary cholangitis (HCC)   NASH (nonalcoholic steatohepatitis)       Left Pleural effusion s/p thoracentesis Pleural effusion likely due to  cirrhosis/portal HTN, complicated potentially by pneumonia but pneumonia ruled out Acute respiratory failure with hypoxia due to above -resolved Supplemental O2 as needed --> has been on RA  Repeat CXR in AM --> little improvement, asked IR to perform US guided to ensure maximal evacuation --> repeat thoracentesis yielded another 1.6 L Low suspicion for PNA, have d/c abx   Hepatic cirrhosis ([place]) due to primary biliary cholangitis/NASH Esophageal varices in cirrhosis ([place]) History of esophageal varices with bleeding Primary biliary cholangitis (HCC) NASH (nonalcoholic steatohepatitis) Elevated LFTs - mild/stable Ascites Portal hypertension Reviewed most recent GI notes from outpatient visit 07/15/2021, follows at [facility] clinic w/ Dr Chauncey Mann Per med history, patient has not been taking nadolol Continuing pantoprazole, ondansetron Patient started on Lasix, Spironolactone Needs outpatient follow-up with hepatology, per GI likely will need transplant               Discharge Instructions  Allergies as of 03/18/2022   No Known Allergies      Medication List     TAKE these medications    furosemide 20 MG tablet Commonly known as: LASIX Take 1 tablet (20 mg total) by mouth daily. Start taking on: March 19, 2022   nadolol 20 MG tablet Commonly known as: CORGARD Take 20 mg by mouth daily.   ondansetron 4 MG tablet Commonly known as: ZOFRAN Take 4 mg by mouth every 8 (eight) hours as needed for nausea.   pantoprazole 40 MG tablet Commonly known as: PROTONIX Take 40 mg by mouth 2 (two) times daily.   spironolactone 25 MG tablet Commonly known as: ALDACTONE Take 1 tablet (25 mg total) by mouth daily.         Follow-up Information     Annamaria Helling, DO Follow up.   Specialty: Gastroenterology Why: patient is making own follow up appt Contact information: [facility] [place] [address] [phone]                  No Known Allergies   Subjective: Patient states breathing is back to baseline, she feels ready for discharge.  Has appointment next week with PCP.   Discharge Exam: BP 115/74 (BP Location: Left Arm)   Pulse 92   Temp 97.9 F (36.6 C)   Resp 17   Ht 6' (1.829 m)   Wt 102.1 kg   SpO2 94%   BMI 30.52 kg/m  General: Pt is alert, awake, not in acute distress Cardiovascular: RRR, S1/S2 +, no rubs, no gallops Respiratory: CTA bilaterally, no wheezing, no rhonchi.  Diminished breath sounds left mid/lower thorax but breath sounds are appropriate at left apex Abdominal: Soft, NT, ND, bowel sounds + Extremities: no edema, no cyanosis     The results of significant diagnostics from this hospitalization (including imaging, microbiology, ancillary and laboratory) are listed below for reference.     Microbiology: Recent Results (from the past 240 hour(s))  Resp panel by RT-PCR (RSV, Flu A&B, Covid) Anterior Nasal Swab     Status: None   Collection Time: 03/16/22  3:42 PM   Specimen: Anterior  Nasal Swab  Result Value Ref Range Status   SARS Coronavirus 2 by RT PCR NEGATIVE NEGATIVE Final    Comment: (NOTE) SARS-CoV-2 target nucleic acids are NOT DETECTED.  The SARS-CoV-2 RNA is generally detectable in upper respiratory specimens during the acute phase of infection. The lowest concentration of SARS-CoV-2 viral copies this assay can detect is 138 copies/mL. A negative result does not preclude SARS-Cov-2 infection and should not be used as the sole basis for treatment or other patient management decisions. A negative result may occur with  improper specimen collection/handling, submission of specimen other than nasopharyngeal swab, presence of viral mutation(s) within the areas targeted by this assay, and inadequate number of viral copies(<138 copies/mL). A negative result must be combined with clinical observations, patient history, and epidemiological information. The expected  result is Negative.  Fact Sheet for Patients:  EntrepreneurPulse.com.au  Fact Sheet for Healthcare Providers:  IncredibleEmployment.be  This test is no t yet approved or cleared by the [place] FDA and  has been authorized for detection and/or diagnosis of SARS-CoV-2 by FDA under an Emergency Use Authorization (EUA). This EUA will remain  in effect (meaning this test can be used) for the duration of the COVID-19 declaration under Section 564(b)(1) of the Act, 21 U.S.C.section 360bbb-3(b)(1), unless the authorization is terminated  or revoked sooner.       Influenza A by PCR NEGATIVE NEGATIVE Final   Influenza B by PCR NEGATIVE NEGATIVE Final    Comment: (NOTE) The Xpert Xpress SARS-CoV-2/FLU/RSV plus assay is intended as an aid in the diagnosis of influenza from Nasopharyngeal swab specimens and should not be used as a sole basis for treatment. Nasal washings and aspirates are unacceptable for Xpert Xpress SARS-CoV-2/FLU/RSV testing.  Fact Sheet for Patients: EntrepreneurPulse.com.au  Fact Sheet for Healthcare Providers: IncredibleEmployment.be  This test is not yet approved or cleared by the [place] FDA and has been authorized for detection and/or diagnosis of SARS-CoV-2 by FDA under an Emergency Use Authorization (EUA). This EUA will remain in effect (meaning this test can be used) for the duration of the COVID-19 declaration under Section 564(b)(1) of the Act, 21 U.S.C. section 360bbb-3(b)(1), unless the authorization is terminated or revoked.     Resp Syncytial Virus by PCR NEGATIVE NEGATIVE Final    Comment: (NOTE) Fact Sheet for Patients: EntrepreneurPulse.com.au  Fact Sheet for Healthcare Providers: IncredibleEmployment.be  This test is not yet approved or cleared by the [place] FDA and has been authorized for detection and/or diagnosis of  SARS-CoV-2 by FDA under an Emergency Use Authorization (EUA). This EUA will remain in effect (meaning this test can be used) for the duration of the COVID-19 declaration under Section 564(b)(1) of the Act, 21 U.S.C. section 360bbb-3(b)(1), unless the authorization is terminated or revoked.  Performed at [facility], [address] [place], [place] [address]   Culture, blood (Routine X 2) w Reflex to ID Panel     Status: None (Preliminary result)   Collection Time: 03/16/22  7:24 PM   Specimen: BLOOD  Result Value Ref Range Status   Specimen Description BLOOD LEFTAC  Final   Special Requests   Final    BOTTLES DRAWN AEROBIC AND ANAEROBIC Blood Culture adequate volume   Culture   Final    NO GROWTH 2 DAYS Performed at [facility], [address] [place],  [address]    Report Status PENDING  Incomplete  Body fluid culture w Gram Stain     Status: None (  Preliminary result)   Collection Time: 03/16/22  9:18 PM   Specimen: Pleura; Body Fluid  Result Value Ref Range Status   Specimen Description   Final    PLEURAL Performed at [facility], [address] [place], [place] [address]    Special Requests   Final    NONE Performed at [facility], [place], [place] [address]    Gram Stain NO WBC SEEN NO ORGANISMS SEEN   Final   Culture   Final    NO GROWTH 1 DAY Performed at [facility], [place] [address] [place], [place] [address]    Report Status PENDING  Incomplete  Culture, blood (Routine X 2) w Reflex to ID Panel     Status: None (Preliminary result)   Collection Time: 03/17/22  3:05 PM   Specimen: BLOOD  Result Value Ref Range Status   Specimen Description BLOOD RW  Final   Special Requests   Final    BOTTLES DRAWN AEROBIC AND ANAEROBIC Blood Culture adequate volume   Culture   Final    NO GROWTH < 24 HOURS Performed at [facility], [address] [place], [place] [address]    Report  Status PENDING  Incomplete     Labs: BNP (last 3 results) Recent Labs    03/16/22 1541  BNP [id number]   Basic Metabolic Panel: Recent Labs  Lab 03/16/22 1541 03/17/22 0601 03/18/22 0640  NA 138 137 137  K 3.5 3.7 3.4*  CL 110 110 111  CO2 20* 20* 20*  GLUCOSE 99 110* 125*  BUN 17 13 10   CREATININE 0.59 0.57 0.60  CALCIUM 8.4* 8.0* 7.7*   Liver Function Tests: Recent Labs  Lab 03/16/22 1541 03/17/22 0601  AST 65* 57*  ALT 28 26  ALKPHOS 155* 141*  BILITOT 1.0 1.2  PROT 8.2* 7.2  ALBUMIN 3.3* 2.8*   No results for input(s): "LIPASE", "AMYLASE" in the last 168 hours. No results for input(s): "AMMONIA" in the last 168 hours. CBC: Recent Labs  Lab 03/16/22 1541 03/17/22 0601  WBC 3.9* 3.1*  NEUTROABS 2.4  --   HGB 14.2 12.5  HCT 43.6 38.3  MCV 86.9 87.0  PLT 69* 51*   Cardiac Enzymes: No results for input(s): "CKTOTAL", "CKMB", "CKMBINDEX", "TROPONINI" in the last 168 hours. BNP: Invalid input(s): "POCBNP" CBG: No results for input(s): "GLUCAP" in the last 168 hours. D-Dimer No results for input(s): "DDIMER" in the last 72 hours. Hgb A1c No results for input(s): "HGBA1C" in the last 72 hours. Lipid Profile No results for input(s): "CHOL", "HDL", "LDLCALC", "TRIG", "CHOLHDL", "LDLDIRECT" in the last 72 hours. Thyroid function studies No results for input(s): "TSH", "T4TOTAL", "T3FREE", "THYROIDAB" in the last 72 hours.  Invalid input(s): "FREET3" Anemia work up Recent Labs    03/17/22 0601  VITAMINB12 694  FOLATE 8.8   Urinalysis No results found for: "COLORURINE", "APPEARANCEUR", "LABSPEC", "PHURINE", "GLUCOSEU", "HGBUR", "BILIRUBINUR", "KETONESUR", "PROTEINUR", "UROBILINOGEN", "NITRITE", "LEUKOCYTESUR" Sepsis Labs Recent Labs  Lab 03/16/22 1541 03/17/22 0601  WBC 3.9* 3.1*   Microbiology Recent Results (from the past 240 hour(s))  Resp panel by RT-PCR (RSV, Flu A&B, Covid) Anterior Nasal Swab     Status: None   Collection Time: 03/16/22   3:42 PM   Specimen: Anterior Nasal Swab  Result Value Ref Range Status   SARS Coronavirus 2 by RT PCR NEGATIVE NEGATIVE Final    Comment: (NOTE) SARS-CoV-2 target nucleic acids are NOT DETECTED.  The  SARS-CoV-2 RNA is generally detectable in upper respiratory specimens during the acute phase of infection. The lowest concentration of SARS-CoV-2 viral copies this assay can detect is 138 copies/mL. A negative result does not preclude SARS-Cov-2 infection and should not be used as the sole basis for treatment or other patient management decisions. A negative result may occur with  improper specimen collection/handling, submission of specimen other than nasopharyngeal swab, presence of viral mutation(s) within the areas targeted by this assay, and inadequate number of viral copies(<138 copies/mL). A negative result must be combined with clinical observations, patient history, and epidemiological information. The expected result is Negative.  Fact Sheet for Patients:  BloggerCourse.com  Fact Sheet for Healthcare Providers:  SeriousBroker.it  This test is no t yet approved or cleared by the [place] FDA and  has been authorized for detection and/or diagnosis of SARS-CoV-2 by FDA under an Emergency Use Authorization (EUA). This EUA will remain  in effect (meaning this test can be used) for the duration of the COVID-19 declaration under Section 564(b)(1) of the Act, 21 U.S.C.section 360bbb-3(b)(1), unless the authorization is terminated  or revoked sooner.       Influenza A by PCR NEGATIVE NEGATIVE Final   Influenza B by PCR NEGATIVE NEGATIVE Final    Comment: (NOTE) The Xpert Xpress SARS-CoV-2/FLU/RSV plus assay is intended as an aid in the diagnosis of influenza from Nasopharyngeal swab specimens and should not be used as a sole basis for treatment. Nasal washings and aspirates are unacceptable for Xpert Xpress  SARS-CoV-2/FLU/RSV testing.  Fact Sheet for Patients: BloggerCourse.com  Fact Sheet for Healthcare Providers: SeriousBroker.it  This test is not yet approved or cleared by the [place] FDA and has been authorized for detection and/or diagnosis of SARS-CoV-2 by FDA under an Emergency Use Authorization (EUA). This EUA will remain in effect (meaning this test can be used) for the duration of the COVID-19 declaration under Section 564(b)(1) of the Act, 21 U.S.C. section 360bbb-3(b)(1), unless the authorization is terminated or revoked.     Resp Syncytial Virus by PCR NEGATIVE NEGATIVE Final    Comment: (NOTE) Fact Sheet for Patients: BloggerCourse.com  Fact Sheet for Healthcare Providers: SeriousBroker.it  This test is not yet approved or cleared by the [place] FDA and has been authorized for detection and/or diagnosis of SARS-CoV-2 by FDA under an Emergency Use Authorization (EUA). This EUA will remain in effect (meaning this test can be used) for the duration of the COVID-19 declaration under Section 564(b)(1) of the Act, 21 U.S.C. section 360bbb-3(b)(1), unless the authorization is terminated or revoked.  Performed at [facility], [address] [place], [place] [address]   Culture, blood (Routine X 2) w Reflex to ID Panel     Status: None (Preliminary result)   Collection Time: 03/16/22  7:24 PM   Specimen: BLOOD  Result Value Ref Range Status   Specimen Description BLOOD LEFTAC  Final   Special Requests   Final    BOTTLES DRAWN AEROBIC AND ANAEROBIC Blood Culture adequate volume   Culture   Final    NO GROWTH 2 DAYS Performed at [facility], [address] [place], [place] [address]    Report Status PENDING  Incomplete  Body fluid culture w Gram Stain     Status: None (Preliminary result)   Collection Time: 03/16/22  9:18 PM    Specimen: Pleura; Body Fluid  Result Value Ref Range Status   Specimen Description   Final    PLEURAL  Performed at [facility], [address], [place], [place] [address]    Special Requests   Final    NONE Performed at [facility], [place], [place] [address]    Gram Stain NO WBC SEEN NO ORGANISMS SEEN   Final   Culture   Final    NO GROWTH 1 DAY Performed at [facility], [place] [address], [place], [place] [address]    Report Status PENDING  Incomplete  Culture, blood (Routine X 2) w Reflex to ID Panel     Status: None (Preliminary result)   Collection Time: 03/17/22  3:05 PM   Specimen: BLOOD  Result Value Ref Range Status   Specimen Description BLOOD RW  Final   Special Requests   Final    BOTTLES DRAWN AEROBIC AND ANAEROBIC Blood Culture adequate volume   Culture   Final    NO GROWTH < 24 HOURS Performed at [facility] [address], [place], [place] [address]    Report Status PENDING  Incomplete   Imaging DG Chest Port 1 View  Result Date: 03/17/2022 CLINICAL DATA:  Pleural effusion.  Status post thoracentesis. EXAM: PORTABLE CHEST 1 VIEW COMPARISON:  Chest radiographs and CT 03/16/2022 FINDINGS: The left heart border remains obscured. There is improved aeration of the left lung apex, while the remainder of the left hemithorax remains completely opacified due to a large pleural effusion and atelectasis based on CT. Mild interstitial accentuation in the right lung is unchanged. No pneumothorax is identified. IMPRESSION: Improved aeration of the left lung apex. Persistent large left pleural effusion and associated atelectasis. No pneumothorax. Electronically Signed   By: Logan Bores M.D.   On: 03/17/2022 08:20   US Venous Img Lower Bilateral (DVT)  Result Date: 03/17/2022 CLINICAL DATA:  Swelling EXAM: BILATERAL LOWER EXTREMITY VENOUS DOPPLER ULTRASOUND TECHNIQUE: Gray-scale sonography with graded compression, as well as  color Doppler and duplex ultrasound were performed to evaluate the lower extremity deep venous systems from the level of the common femoral vein and including the common femoral, femoral, profunda femoral, popliteal and calf veins including the posterior tibial, peroneal and gastrocnemius veins when visible. The superficial great saphenous vein was also interrogated. Spectral Doppler was utilized to evaluate flow at rest and with distal augmentation maneuvers in the common femoral, femoral and popliteal veins. COMPARISON:  None Available. FINDINGS: RIGHT LOWER EXTREMITY Common Femoral Vein: No evidence of thrombus. Normal compressibility, respiratory phasicity and response to augmentation. Saphenofemoral Junction: No evidence of thrombus. Normal compressibility and flow on color Doppler imaging. Profunda Femoral Vein: No evidence of thrombus. Normal compressibility and flow on color Doppler imaging. Femoral Vein: No evidence of thrombus. Normal compressibility, respiratory phasicity and response to augmentation. Popliteal Vein: No evidence of thrombus. Normal compressibility, respiratory phasicity and response to augmentation. Calf Veins: No evidence of thrombus. Normal compressibility and flow on color Doppler imaging. Superficial Great Saphenous Vein: No evidence of thrombus. Normal compressibility. Venous Reflux:  None. Other Findings:  None. LEFT LOWER EXTREMITY Common Femoral Vein: No evidence of thrombus. Normal compressibility, respiratory phasicity and response to augmentation. Saphenofemoral Junction: No evidence of thrombus. Normal compressibility and flow on color Doppler imaging. Profunda Femoral Vein: No evidence of thrombus. Normal compressibility and flow on color Doppler imaging. Femoral Vein: No evidence of thrombus. Normal compressibility, respiratory phasicity and response to augmentation. Popliteal Vein: No evidence of thrombus. Normal compressibility, respiratory phasicity and response to  augmentation. Calf Veins: No evidence of thrombus. Normal  compressibility and flow on color Doppler imaging. Superficial Great Saphenous Vein: No evidence of thrombus. Normal compressibility. Venous Reflux:  None. Other Findings:  None. IMPRESSION: No evidence of deep venous thrombosis in either lower extremity. Electronically Signed   By: Sammie Bench M.D.   On: 03/17/2022 01:39   DG Chest Port 1 View  Result Date: 03/16/2022 CLINICAL DATA:  Post procedural discomfort EXAM: PORTABLE CHEST 1 VIEW COMPARISON:  03/16/2022 at 3:55 p.m. FINDINGS: No significant change from radiographs earlier today. Slight leftward mediastinal shift. Complete opacification of the left hemithorax. Mild interstitial thickening in the right lung. IMPRESSION: Complete opacification of the left hemithorax, unchanged. This was due to a large left pleural effusion and atelectasis on CT 03/16/2022 Electronically Signed   By: Placido Sou M.D.   On: 03/16/2022 21:36   CT CHEST WO CONTRAST  Result Date: 03/16/2022 CLINICAL DATA:  Shortness of breath. EXAM: CT CHEST WITHOUT CONTRAST TECHNIQUE: Multidetector CT imaging of the chest was performed following the standard protocol without IV contrast. RADIATION DOSE REDUCTION: This exam was performed according to the departmental dose-optimization program which includes automated exposure control, adjustment of the mA and/or kV according to patient size and/or use of iterative reconstruction technique. COMPARISON:  None Available. FINDINGS: Cardiovascular: No significant vascular findings. Normal heart size. A 5 mm thick posterolateral pericardial effusion is seen. Mediastinum/Nodes: No enlarged mediastinal or axillary lymph nodes. Thyroid gland, trachea, and esophagus demonstrate no significant findings. Lungs/Pleura: Extensive amount of consolidation is noted throughout the left upper lobe and left lower lobe without evidence of aerated lung on the left. Multiple small areas of  atelectasis versus early infiltrate are seen within the right upper lobe. A very large left pleural effusion is seen with subsequent mass effect on the heart and mediastinal structures. A 7 mm pleural based noncalcified lung nodule is seen within the lateral aspect of the right lower lobe (axial CT image 110, CT series 3). 3 mm and 6 mm pleural based noncalcified lung nodule is seen within the posteromedial aspect of the right lower lobe (axial CT image 81, CT series 3). A 2 mm anterolateral right upper lobe noncalcified lung nodule is seen (axial CT image 79, CT series 3). No pneumothorax is identified. Upper Abdomen: The liver is cirrhotic in appearance. The spleen is markedly enlarged. A mild to moderate amount of abdominal ascites is seen within the upper abdomen. The portal vein is markedly dilated and measures 3.4 cm x 3.0 cm in diameter. The splenic vein is also dilated. Musculoskeletal: No chest wall mass or suspicious bone lesions identified. IMPRESSION: 1. Extensive consolidation throughout the left upper lobe and left lower lobe, consistent with pneumonia. 2. Very large left pleural effusion. 3. Multiple small areas of right upper lobe atelectasis versus early infiltrate. 4. Multiple small pleural based noncalcified lung nodules within the right lung, as described above. No follow-up needed if patient is low-risk (and has no known or suspected primary neoplasm). Non-contrast chest CT can be considered in 12 months if patient is high-risk. This recommendation follows the consensus statement: Guidelines for Management of Incidental Pulmonary Nodules Detected on CT Images: From the Fleischner Society 2017; Radiology 2017; 284:228-243. 5. 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 6. Mild to moderate amount of abdominal ascites. Electronically Signed   By: Virgina Norfolk M.D.   On: 03/16/2022 18:05   DG Chest 2 View  Result Date: 03/16/2022 CLINICAL DATA:  Shortness of breath EXAM: CHEST -  2 VIEW COMPARISON:  None Available.  FINDINGS: There is complete opacification of the left hemithorax. The right lung is clear. Cardiomediastinal silhouette is obscured by left-sided opacities, but is otherwise within normal limits on the right. No pneumothorax. The osseous structures are within normal limits. IMPRESSION: Complete opacification of the left hemithorax, likely a combination of pleural effusion and airspace disease. Other pathology not excluded. Electronically Signed   By: Ronney Asters M.D.   On: 03/16/2022 16:07      Time coordinating discharge: over 30 minutes  SIGNED:  Emeterio Reeve DO [facility]

## 2022-03-20 ENCOUNTER — Telehealth: Payer: Self-pay

## 2022-03-20 LAB — BODY FLUID CULTURE W GRAM STAIN
Culture: NO GROWTH
Gram Stain: NONE SEEN

## 2022-03-22 LAB — CULTURE, BLOOD (ROUTINE X 2)
Culture: NO GROWTH
Special Requests: ADEQUATE

## 2022-03-23 LAB — CULTURE, BLOOD (ROUTINE X 2): Special Requests: ADEQUATE

## 2022-03-24 LAB — MISC LABCORP TEST (SEND OUT): Labcorp test code: 9985

## 2022-05-10 ENCOUNTER — Other Ambulatory Visit: Payer: Self-pay

## 2022-05-10 NOTE — Progress Notes (Signed)
Pt completed pre-employment UDS, HR NOTIFIED. Burna Sis

## 2022-07-13 ENCOUNTER — Other Ambulatory Visit: Payer: Self-pay

## 2022-07-13 DIAGNOSIS — Z0000 Encounter for general adult medical examination without abnormal findings: Secondary | ICD-10-CM

## 2022-07-13 DIAGNOSIS — K7469 Other cirrhosis of liver: Secondary | ICD-10-CM

## 2022-07-13 NOTE — Progress Notes (Signed)
PT COMPLETED LABS FOR PHYSICAL OUTSIDE PROVIDER.

## 2022-07-15 LAB — CMP12+LP+TP+TSH+6AC+CBC/D/PLT
ALT: 27 IU/L (ref 0–32)
AST: 62 IU/L — ABNORMAL HIGH (ref 0–40)
Albumin/Globulin Ratio: 0.7 — ABNORMAL LOW (ref 1.2–2.2)
Albumin: 3 g/dL — ABNORMAL LOW (ref 3.9–4.9)
Alkaline Phosphatase: 186 IU/L — ABNORMAL HIGH (ref 44–121)
BUN/Creatinine Ratio: 16 (ref 9–23)
BUN: 9 mg/dL (ref 6–24)
Basophils Absolute: 0 10*3/uL (ref 0.0–0.2)
Basos: 1 %
Bilirubin Total: 1.3 mg/dL — ABNORMAL HIGH (ref 0.0–1.2)
Calcium: 8.1 mg/dL — ABNORMAL LOW (ref 8.7–10.2)
Chloride: 108 mmol/L — ABNORMAL HIGH (ref 96–106)
Chol/HDL Ratio: 3.6 ratio (ref 0.0–4.4)
Cholesterol, Total: 118 mg/dL (ref 100–199)
Creatinine, Ser: 0.56 mg/dL — ABNORMAL LOW (ref 0.57–1.00)
EOS (ABSOLUTE): 0.1 10*3/uL (ref 0.0–0.4)
Eos: 5 %
Estimated CHD Risk: 0.6 times avg. (ref 0.0–1.0)
Free Thyroxine Index: 2.5 (ref 1.2–4.9)
GGT: 60 IU/L (ref 0–60)
Globulin, Total: 4.2 g/dL (ref 1.5–4.5)
Glucose: 125 mg/dL — ABNORMAL HIGH (ref 70–99)
HDL: 33 mg/dL — ABNORMAL LOW (ref >39)
Hematocrit: 38.8 % (ref 34.0–46.6)
Hemoglobin: 13.2 g/dL (ref 11.1–15.9)
Immature Grans (Abs): 0 10*3/uL (ref 0.0–0.1)
Immature Granulocytes: 0 %
Iron: 51 ug/dL (ref 27–159)
LDH: 208 IU/L (ref 119–226)
LDL Chol Calc (NIH): 71 mg/dL (ref 0–99)
Lymphocytes Absolute: 0.6 10*3/uL — ABNORMAL LOW (ref 0.7–3.1)
Lymphs: 25 %
MCH: 28.7 pg (ref 26.6–33.0)
MCHC: 34 g/dL (ref 31.5–35.7)
MCV: 84 fL (ref 79–97)
Monocytes Absolute: 0.1 10*3/uL (ref 0.1–0.9)
Monocytes: 5 %
Neutrophils Absolute: 1.6 10*3/uL (ref 1.4–7.0)
Neutrophils: 64 %
Phosphorus: 3.1 mg/dL (ref 3.0–4.3)
Platelets: 52 10*3/uL — CL (ref 150–450)
Potassium: 3.8 mmol/L (ref 3.5–5.2)
RBC: 4.6 x10E6/uL (ref 3.77–5.28)
RDW: 14.9 % (ref 11.7–15.4)
Sodium: 136 mmol/L (ref 134–144)
T3 Uptake Ratio: 31 % (ref 24–39)
T4, Total: 8.1 ug/dL (ref 4.5–12.0)
TSH: 4.01 u[IU]/mL (ref 0.450–4.500)
Total Protein: 7.2 g/dL (ref 6.0–8.5)
Triglycerides: 66 mg/dL (ref 0–149)
Uric Acid: 4.2 mg/dL (ref 2.6–6.2)
VLDL Cholesterol Cal: 14 mg/dL (ref 5–40)
WBC: 2.5 10*3/uL — CL (ref 3.4–10.8)
eGFR: 113 mL/min/{1.73_m2} (ref >59)

## 2022-07-15 LAB — PT AND PTT
INR: 1.5 — ABNORMAL HIGH (ref 0.9–1.2)
Prothrombin Time: 15.4 s — ABNORMAL HIGH (ref 9.1–12.0)
aPTT: 34 s — ABNORMAL HIGH (ref 24–33)

## 2022-07-20 ENCOUNTER — Encounter: Payer: Self-pay | Admitting: Emergency Medicine

## 2022-07-21 ENCOUNTER — Other Ambulatory Visit: Payer: Self-pay | Admitting: Emergency Medicine

## 2022-07-21 DIAGNOSIS — K7460 Unspecified cirrhosis of liver: Secondary | ICD-10-CM

## 2022-07-26 ENCOUNTER — Other Ambulatory Visit: Payer: Self-pay | Admitting: Physician Assistant

## 2022-07-26 DIAGNOSIS — K743 Primary biliary cirrhosis: Secondary | ICD-10-CM

## 2022-07-27 ENCOUNTER — Ambulatory Visit
Admission: RE | Admit: 2022-07-27 | Discharge: 2022-07-27 | Disposition: A | Discharge status: Home / Self Care | Payer: BC Managed Care – PPO | Source: Ambulatory Visit | Attending: Physician Assistant | Admitting: Physician Assistant

## 2022-07-27 DIAGNOSIS — K743 Primary biliary cirrhosis: Secondary | ICD-10-CM | POA: Diagnosis present

## 2022-07-27 MED ORDER — ALBUMIN HUMAN 25 % IV SOLN
25.0000 g | Freq: Once | INTRAVENOUS | Status: AC
  Administered 2022-07-27: 25 g via INTRAVENOUS

## 2022-07-27 MED ORDER — ALBUMIN HUMAN 25 % IV SOLN
INTRAVENOUS | Status: AC
  Filled 2022-07-27: qty 100

## 2022-07-27 MED ORDER — ALBUMIN HUMAN 25 % IV SOLN: 25.0000 g | Freq: Once | INTRAVENOUS | Status: AC

## 2022-07-27 MED ORDER — LIDOCAINE HCL (PF) 1 % IJ SOLN
10.0000 mL | Freq: Once | INTRAMUSCULAR | Status: AC
  Administered 2022-07-27: 10 mL via SUBCUTANEOUS
  Filled 2022-07-27: qty 10

## 2022-07-27 NOTE — Procedures (Signed)
PROCEDURE SUMMARY:  Successful US guided therapeutic paracentesis from RLQ.  Yielded 8.4 L of clear, yellow fluid.  No immediate complications.  Pt tolerated well.   Specimen not sent for labs.  EBL < 1 mL  Shon Hough, AGNP 07/27/2022 11:38 AM

## 2022-08-10 ENCOUNTER — Ambulatory Visit
Admission: RE | Admit: 2022-08-10 | Discharge: 2022-08-10 | Disposition: A | Discharge status: Home / Self Care | Payer: BC Managed Care – PPO | Source: Ambulatory Visit | Attending: Emergency Medicine | Admitting: Emergency Medicine

## 2022-08-10 DIAGNOSIS — K7460 Unspecified cirrhosis of liver: Secondary | ICD-10-CM

## 2022-08-12 ENCOUNTER — Ambulatory Visit
Admission: RE | Admit: 2022-08-12 | Discharge: 2022-08-12 | Disposition: A | Discharge status: Home / Self Care | Payer: BC Managed Care – PPO | Source: Ambulatory Visit | Attending: Internal Medicine | Admitting: Internal Medicine

## 2022-08-12 ENCOUNTER — Other Ambulatory Visit: Payer: Self-pay | Admitting: Internal Medicine

## 2022-08-12 DIAGNOSIS — K766 Portal hypertension: Secondary | ICD-10-CM | POA: Diagnosis present

## 2022-08-12 DIAGNOSIS — K743 Primary biliary cirrhosis: Secondary | ICD-10-CM | POA: Diagnosis not present

## 2022-08-12 MED ORDER — ALBUMIN HUMAN 25 % IV SOLN
50.0000 g | Freq: Once | INTRAVENOUS | Status: AC
  Administered 2022-08-12: 50 g via INTRAVENOUS

## 2022-08-12 MED ORDER — LIDOCAINE HCL (PF) 1 % IJ SOLN
10.0000 mL | Freq: Once | INTRAMUSCULAR | Status: AC
  Administered 2022-08-12: 10 mL via INTRADERMAL
  Filled 2022-08-12: qty 10

## 2022-08-12 MED ORDER — ALBUMIN HUMAN 25 % IV SOLN
INTRAVENOUS | Status: AC
  Filled 2022-08-12: qty 200

## 2022-08-12 NOTE — Procedures (Signed)
PROCEDURE SUMMARY:  Successful image-guided paracentesis from the right lower abdomen.  Yielded 8 liters of yellow fluid.  No immediate complications.  EBL = trace. Patient tolerated well.   Specimen was not sent for labs.  Please see imaging section of Epic for full dictation.   Kennieth Francois PA-C 08/12/2022 1:26 PM

## 2022-08-18 ENCOUNTER — Other Ambulatory Visit: Payer: Self-pay | Admitting: Internal Medicine

## 2022-08-18 ENCOUNTER — Ambulatory Visit
Admission: RE | Admit: 2022-08-18 | Discharge: 2022-08-18 | Disposition: A | Discharge status: Home / Self Care | Payer: BC Managed Care – PPO | Source: Ambulatory Visit | Attending: Internal Medicine | Admitting: Internal Medicine

## 2022-08-18 DIAGNOSIS — K743 Primary biliary cirrhosis: Secondary | ICD-10-CM | POA: Insufficient documentation

## 2022-08-18 DIAGNOSIS — K766 Portal hypertension: Secondary | ICD-10-CM

## 2022-08-18 MED ORDER — ALBUMIN HUMAN 25 % IV SOLN
50.0000 g | Freq: Once | INTRAVENOUS | Status: AC
  Administered 2022-08-18: 50 g via INTRAVENOUS

## 2022-08-18 MED ORDER — LIDOCAINE HCL (PF) 1 % IJ SOLN
10.0000 mL | Freq: Once | INTRAMUSCULAR | Status: AC
  Administered 2022-08-18: 10 mL via SUBCUTANEOUS

## 2022-08-18 MED ORDER — ALBUMIN HUMAN 25 % IV SOLN
INTRAVENOUS | Status: AC
  Filled 2022-08-18: qty 100

## 2022-08-18 NOTE — Procedures (Signed)
PROCEDURE SUMMARY:  Successful image-guided paracentesis from the right lower abdomen.  Yielded 8 liters of opaque yellow fluid.  No immediate complications.  EBL = trace. Patient tolerated well.   Specimen was not sent for labs.  Please see imaging section of Epic for full dictation.   Kennieth Francois PA-C 08/18/2022 3:28 PM

## 2022-10-12 ENCOUNTER — Other Ambulatory Visit: Payer: Self-pay | Admitting: Internal Medicine

## 2022-10-12 DIAGNOSIS — Z1231 Encounter for screening mammogram for malignant neoplasm of breast: Secondary | ICD-10-CM

## 2022-10-18 ENCOUNTER — Other Ambulatory Visit: Payer: Self-pay | Admitting: Internal Medicine

## 2022-10-18 DIAGNOSIS — J90 Pleural effusion, not elsewhere classified: Secondary | ICD-10-CM

## 2022-10-19 ENCOUNTER — Other Ambulatory Visit: Payer: Self-pay | Admitting: Student

## 2022-10-19 ENCOUNTER — Ambulatory Visit: Admission: RE | Admit: 2022-10-19 | Payer: BC Managed Care – PPO | Source: Ambulatory Visit

## 2022-10-19 ENCOUNTER — Ambulatory Visit
Admission: RE | Admit: 2022-10-19 | Discharge: 2022-10-19 | Disposition: A | Discharge status: Home / Self Care | Payer: BC Managed Care – PPO | Source: Ambulatory Visit | Attending: Internal Medicine | Admitting: Internal Medicine

## 2022-10-19 DIAGNOSIS — J90 Pleural effusion, not elsewhere classified: Secondary | ICD-10-CM

## 2022-10-19 DIAGNOSIS — Z9889 Other specified postprocedural states: Secondary | ICD-10-CM

## 2022-10-19 DIAGNOSIS — K8309 Other cholangitis: Secondary | ICD-10-CM | POA: Insufficient documentation

## 2022-10-19 DIAGNOSIS — K7460 Unspecified cirrhosis of liver: Secondary | ICD-10-CM | POA: Diagnosis not present

## 2022-10-19 MED ORDER — LIDOCAINE HCL (PF) 1 % IJ SOLN
10.0000 mL | Freq: Once | INTRAMUSCULAR | Status: AC
  Administered 2022-10-19: 10 mL via INTRADERMAL
  Filled 2022-10-19: qty 10

## 2022-10-19 NOTE — Procedures (Signed)
PROCEDURE SUMMARY:  Successful US guided left thoracentesis. Yielded 2 L of clear yellow fluid. Pt tolerated procedure well. No immediate complications.  Specimen not sent for labs. CXR ordered; no post-procedure pneumothorax identified.  EBL < 2 mL  Mickie Kay, NP 10/19/2022 12:04 PM

## 2022-10-26 ENCOUNTER — Ambulatory Visit
Admission: RE | Admit: 2022-10-26 | Discharge: 2022-10-26 | Disposition: A | Discharge status: Home / Self Care | Payer: BC Managed Care – PPO | Source: Ambulatory Visit | Attending: Internal Medicine | Admitting: Internal Medicine

## 2022-10-26 DIAGNOSIS — Z1231 Encounter for screening mammogram for malignant neoplasm of breast: Secondary | ICD-10-CM | POA: Diagnosis present

## 2022-12-02 ENCOUNTER — Other Ambulatory Visit: Payer: Self-pay | Admitting: Internal Medicine

## 2022-12-02 DIAGNOSIS — J90 Pleural effusion, not elsewhere classified: Secondary | ICD-10-CM

## 2022-12-08 ENCOUNTER — Ambulatory Visit
Admission: RE | Admit: 2022-12-08 | Discharge: 2022-12-08 | Disposition: A | Discharge status: Home / Self Care | Payer: BC Managed Care – PPO | Source: Ambulatory Visit | Attending: Internal Medicine | Admitting: Internal Medicine

## 2022-12-08 ENCOUNTER — Other Ambulatory Visit: Payer: Self-pay | Admitting: Student

## 2022-12-08 ENCOUNTER — Ambulatory Visit
Admission: RE | Admit: 2022-12-08 | Discharge: 2022-12-08 | Disposition: A | Discharge status: Home / Self Care | Payer: BC Managed Care – PPO | Source: Ambulatory Visit | Attending: Student | Admitting: Student

## 2022-12-08 DIAGNOSIS — J9811 Atelectasis: Secondary | ICD-10-CM | POA: Diagnosis not present

## 2022-12-08 DIAGNOSIS — J90 Pleural effusion, not elsewhere classified: Secondary | ICD-10-CM

## 2022-12-08 DIAGNOSIS — Z9889 Other specified postprocedural states: Secondary | ICD-10-CM

## 2022-12-08 NOTE — Procedures (Signed)
PROCEDURE SUMMARY:  Successful image-guided left thoracentesis. Yielded 1.9 L of clear yellow fluid. Pt tolerated procedure well. No immediate complications. EBL = trace   Specimen was not sent for labs. CXR ordered.  Please see imaging section of Epic for full dictation.  Kennieth Francois PA-C 12/08/2022 10:58 AM

## 2023-01-03 ENCOUNTER — Other Ambulatory Visit: Payer: Self-pay | Admitting: Internal Medicine

## 2023-01-03 DIAGNOSIS — K760 Fatty (change of) liver, not elsewhere classified: Secondary | ICD-10-CM

## 2023-01-03 DIAGNOSIS — J90 Pleural effusion, not elsewhere classified: Secondary | ICD-10-CM

## 2023-01-05 ENCOUNTER — Other Ambulatory Visit: Payer: Self-pay | Admitting: Radiology

## 2023-01-05 ENCOUNTER — Ambulatory Visit
Admission: RE | Admit: 2023-01-05 | Discharge: 2023-01-05 | Disposition: A | Discharge status: Home / Self Care | Payer: BC Managed Care – PPO | Source: Ambulatory Visit | Attending: Radiology | Admitting: Radiology

## 2023-01-05 ENCOUNTER — Ambulatory Visit
Admission: RE | Admit: 2023-01-05 | Discharge: 2023-01-05 | Disposition: A | Discharge status: Home / Self Care | Payer: BC Managed Care – PPO | Source: Ambulatory Visit | Attending: Internal Medicine | Admitting: Internal Medicine

## 2023-01-05 DIAGNOSIS — Z9889 Other specified postprocedural states: Secondary | ICD-10-CM | POA: Insufficient documentation

## 2023-01-05 DIAGNOSIS — K760 Fatty (change of) liver, not elsewhere classified: Secondary | ICD-10-CM

## 2023-01-05 DIAGNOSIS — J90 Pleural effusion, not elsewhere classified: Secondary | ICD-10-CM

## 2023-01-05 LAB — BODY FLUID CELL COUNT WITH DIFFERENTIAL
Eos, Fluid: 0 %
Lymphs, Fluid: 51 %
Monocyte-Macrophage-Serous Fluid: 45 %
Neutrophil Count, Fluid: 4 %
Total Nucleated Cell Count, Fluid: 170 uL

## 2023-01-05 LAB — PATHOLOGIST SMEAR REVIEW

## 2023-01-05 MED ORDER — LIDOCAINE HCL (PF) 1 % IJ SOLN
20.0000 mL | Freq: Once | INTRAMUSCULAR | Status: AC
  Administered 2023-01-05: 20 mL via INTRADERMAL
  Filled 2023-01-05: qty 20

## 2023-01-05 NOTE — Progress Notes (Addendum)
PROCEDURE SUMMARY:  Successful image-guided Left chest  thoracentesis. Yielded  2  liters of amber fluid. Patient tolerated procedure well. EBL: Zero No immediate complications.  Specimen were sent for labs. Post procedure CXR ordered and reviewed by Dr. Collier Salina. No Pneumothorax   Please see imaging section of Epic for full dictation.  Ardith Dark PA-C 01/05/2023 11:13 AM  Patient ID: Kristin Brooks, female   DOB: [birth year], 47 y.o.   MRN: [id number]

## 2023-01-12 ENCOUNTER — Other Ambulatory Visit: Payer: Self-pay | Admitting: Internal Medicine

## 2023-01-12 DIAGNOSIS — J90 Pleural effusion, not elsewhere classified: Secondary | ICD-10-CM

## 2023-01-12 DIAGNOSIS — K760 Fatty (change of) liver, not elsewhere classified: Secondary | ICD-10-CM

## 2023-01-19 ENCOUNTER — Ambulatory Visit
Admission: RE | Admit: 2023-01-19 | Discharge: 2023-01-19 | Disposition: A | Discharge status: Home / Self Care | Payer: BC Managed Care – PPO | Source: Ambulatory Visit | Attending: Internal Medicine | Admitting: Internal Medicine

## 2023-01-19 ENCOUNTER — Ambulatory Visit
Admission: RE | Admit: 2023-01-19 | Discharge: 2023-01-19 | Disposition: A | Discharge status: Home / Self Care | Payer: Self-pay | Source: Ambulatory Visit | Attending: Radiology | Admitting: Radiology

## 2023-01-19 ENCOUNTER — Other Ambulatory Visit: Payer: Self-pay | Admitting: Radiology

## 2023-01-19 DIAGNOSIS — K760 Fatty (change of) liver, not elsewhere classified: Secondary | ICD-10-CM

## 2023-01-19 DIAGNOSIS — Z9889 Other specified postprocedural states: Secondary | ICD-10-CM

## 2023-01-19 DIAGNOSIS — J90 Pleural effusion, not elsewhere classified: Secondary | ICD-10-CM | POA: Diagnosis present

## 2023-01-19 LAB — BODY FLUID CELL COUNT WITH DIFFERENTIAL
Eos, Fluid: 2 %
Lymphs, Fluid: 34 %
Monocyte-Macrophage-Serous Fluid: 63 %
Neutrophil Count, Fluid: 1 %
Total Nucleated Cell Count, Fluid: 180 uL

## 2023-01-19 LAB — PATHOLOGIST SMEAR REVIEW

## 2023-01-19 MED ORDER — LIDOCAINE HCL (PF) 1 % IJ SOLN
15.0000 mL | Freq: Once | INTRAMUSCULAR | Status: AC
  Administered 2023-01-19: 15 mL via SUBCUTANEOUS
  Filled 2023-01-19: qty 15

## 2023-01-19 NOTE — Discharge Instructions (Signed)
Instructions reviewed with patient. PCS

## 2023-01-21 ENCOUNTER — Emergency Department: Payer: BC Managed Care – PPO

## 2023-01-21 ENCOUNTER — Emergency Department
Admission: EM | Admit: 2023-01-21 | Discharge: 2023-01-21 | Disposition: A | Discharge status: Home / Self Care | Payer: BC Managed Care – PPO | Attending: Emergency Medicine | Admitting: Emergency Medicine

## 2023-01-21 ENCOUNTER — Other Ambulatory Visit: Payer: Self-pay

## 2023-01-21 DIAGNOSIS — R600 Localized edema: Secondary | ICD-10-CM | POA: Insufficient documentation

## 2023-01-21 DIAGNOSIS — R000 Tachycardia, unspecified: Secondary | ICD-10-CM | POA: Insufficient documentation

## 2023-01-21 DIAGNOSIS — K7460 Unspecified cirrhosis of liver: Secondary | ICD-10-CM | POA: Diagnosis not present

## 2023-01-21 DIAGNOSIS — K769 Liver disease, unspecified: Secondary | ICD-10-CM

## 2023-01-21 DIAGNOSIS — J918 Pleural effusion in other conditions classified elsewhere: Secondary | ICD-10-CM | POA: Diagnosis not present

## 2023-01-21 DIAGNOSIS — J9601 Acute respiratory failure with hypoxia: Secondary | ICD-10-CM | POA: Diagnosis not present

## 2023-01-21 DIAGNOSIS — R0602 Shortness of breath: Secondary | ICD-10-CM | POA: Diagnosis present

## 2023-01-21 LAB — BASIC METABOLIC PANEL
Anion gap: 6 (ref 5–15)
BUN: 10 mg/dL (ref 6–20)
CO2: 20 mmol/L — ABNORMAL LOW (ref 22–32)
Calcium: 8.3 mg/dL — ABNORMAL LOW (ref 8.9–10.3)
Chloride: 108 mmol/L (ref 98–111)
Creatinine, Ser: 0.34 mg/dL — ABNORMAL LOW (ref 0.44–1.00)
GFR, Estimated: >60 mL/min (ref >60)
Glucose, Bld: 112 mg/dL — ABNORMAL HIGH (ref 70–99)
Potassium: 3.5 mmol/L (ref 3.5–5.1)
Sodium: 134 mmol/L — ABNORMAL LOW (ref 135–145)

## 2023-01-21 LAB — URINALYSIS, W/ REFLEX TO CULTURE (INFECTION SUSPECTED)
Bilirubin Urine: NEGATIVE
Glucose, UA: NEGATIVE mg/dL
Ketones, ur: NEGATIVE mg/dL
Leukocytes,Ua: NEGATIVE
Nitrite: POSITIVE — AB
Protein, ur: NEGATIVE mg/dL
Specific Gravity, Urine: 1.014 (ref 1.005–1.030)
pH: 6 (ref 5.0–8.0)

## 2023-01-21 LAB — CBC
HCT: 34.1 % — ABNORMAL LOW (ref 36.0–46.0)
Hemoglobin: 11.9 g/dL — ABNORMAL LOW (ref 12.0–15.0)
MCH: 32.2 pg (ref 26.0–34.0)
MCHC: 34.9 g/dL (ref 30.0–36.0)
MCV: 92.2 fL (ref 80.0–100.0)
Platelets: 51 10*3/uL — ABNORMAL LOW (ref 150–400)
RBC: 3.7 MIL/uL — ABNORMAL LOW (ref 3.87–5.11)
RDW: 17.2 % — ABNORMAL HIGH (ref 11.5–15.5)
WBC: 6.6 10*3/uL (ref 4.0–10.5)
nRBC: 0 % (ref 0.0–0.2)

## 2023-01-21 LAB — HEPATIC FUNCTION PANEL
ALT: 23 U/L (ref 0–44)
AST: 62 U/L — ABNORMAL HIGH (ref 15–41)
Albumin: 3 g/dL — ABNORMAL LOW (ref 3.5–5.0)
Alkaline Phosphatase: 158 U/L — ABNORMAL HIGH (ref 38–126)
Bilirubin, Direct: 1.4 mg/dL — ABNORMAL HIGH (ref 0.0–0.2)
Indirect Bilirubin: 3.5 mg/dL — ABNORMAL HIGH (ref 0.3–0.9)
Total Bilirubin: 4.9 mg/dL — ABNORMAL HIGH (ref <1.2)
Total Protein: 6.3 g/dL — ABNORMAL LOW (ref 6.5–8.1)

## 2023-01-21 LAB — TROPONIN I (HIGH SENSITIVITY)
Troponin I (High Sensitivity): 6 ng/L (ref <18)
Troponin I (High Sensitivity): 6 ng/L (ref <18)

## 2023-01-21 LAB — PROTIME-INR
INR: 1.9 — ABNORMAL HIGH (ref 0.8–1.2)
Prothrombin Time: 21.8 s — ABNORMAL HIGH (ref 11.4–15.2)

## 2023-01-21 LAB — BRAIN NATRIURETIC PEPTIDE: B Natriuretic Peptide: 98 pg/mL (ref 0.0–100.0)

## 2023-01-21 MED ORDER — LIDOCAINE-EPINEPHRINE 2 %-1:100000 IJ SOLN
20.0000 mL | Freq: Once | INTRAMUSCULAR | Status: DC
  Filled 2023-01-21: qty 1

## 2023-01-21 MED ORDER — FUROSEMIDE 10 MG/ML IJ SOLN
40.0000 mg | Freq: Once | INTRAMUSCULAR | Status: AC
  Administered 2023-01-21: 40 mg via INTRAVENOUS
  Filled 2023-01-21: qty 4

## 2023-01-21 NOTE — ED Notes (Signed)
[facility] came to pick the pt up at 11:45pm, Called [facility] and [facility] to cancel the ride.

## 2023-01-21 NOTE — ED Notes (Signed)
Pt is in room, interacting with her family. Dr. Scotty Court talked to pt and the pt's family in the importance of being transferred to [facility]. Pt and her family verbalized understanding. Pt was given lip moisturizer. Pt does not have any increased discomfort.

## 2023-01-21 NOTE — ED Provider Notes (Signed)
[facility] Provider Note    Event Date/Time   First MD Initiated Contact with Patient 01/21/23 1552     (approximate)   History   Chief Complaint: Shortness of Breath   HPI  Kristin Brooks is a 47 y.o. female with a history of cirrhosis with portal hypertension, NASH who comes ED complaining of shortness of breath worsening for the past 2 days associated with lower extremity edema.  No fever or chest pain.  No abdominal swelling.  Reports that she had previously had regular paracentesis, and then had TIPS procedure on 12/15/2022.  Since then she has had lower needs for paracentesis.  Outside records reviewed noting last para was 01/17/2023 and had 2 L removed with concurrent albumin infusion.  She then had thoracentesis performed at [facility] on 01/19/2023 with 1.9 L fluid removed.         Physical Exam   Triage Vital Signs: ED Triage Vitals  Encounter Vitals Group     BP 01/21/23 1508 120/68     Systolic BP Percentile --      Diastolic BP Percentile --      Pulse Rate 01/21/23 1508 (!) 105     Resp 01/21/23 1508 (!) 22     Temp 01/21/23 1508 97.9 F (36.6 C)     Temp src --      SpO2 01/21/23 1508 (!) 86 %     Weight --      Height --      Head Circumference --      Peak Flow --      Pain Score 01/21/23 1501 0     Pain Loc --      Pain Education --      Exclude from Growth Chart --     Most recent vital signs: Vitals:   01/21/23 1947 01/21/23 2000  BP: 116/68 125/68  Pulse: (!) 101 (!) 101  Resp: 16   Temp: 98.9 F (37.2 C)   SpO2: 96% 93%    General: Awake, no distress.  CV:  Good peripheral perfusion.  Tachycardia heart rate 105 Resp:  Normal effort.  Absent breath sounds on the left.  Normal breath sounds on the right chest Abd:  No distention.  Soft nontender Other:  2+ pitting edema bilateral lower extremities.  No erythema or tenderness.  Symmetric calf circumference.   ED Results / Procedures / Treatments    Labs (all labs ordered are listed, but only abnormal results are displayed) Labs Reviewed  BASIC METABOLIC PANEL - Abnormal; Notable for the following components:      Result Value   Sodium 134 (*)    CO2 20 (*)    Glucose, Bld 112 (*)    Creatinine, Ser 0.34 (*)    Calcium 8.3 (*)    All other components within normal limits  CBC - Abnormal; Notable for the following components:   RBC 3.70 (*)    Hemoglobin 11.9 (*)    HCT 34.1 (*)    RDW 17.2 (*)    Platelets 51 (*)    All other components within normal limits  HEPATIC FUNCTION PANEL - Abnormal; Notable for the following components:   Total Protein 6.3 (*)    Albumin 3.0 (*)    AST 62 (*)    Alkaline Phosphatase 158 (*)    Total Bilirubin 4.9 (*)    Bilirubin, Direct 1.4 (*)    Indirect Bilirubin 3.5 (*)    All other  components within normal limits  PROTIME-INR - Abnormal; Notable for the following components:   Prothrombin Time 21.8 (*)    INR 1.9 (*)    All other components within normal limits  BRAIN NATRIURETIC PEPTIDE  TROPONIN I (HIGH SENSITIVITY)  TROPONIN I (HIGH SENSITIVITY)     EKG Interpreted by me Sinus tachycardia rate 132.  Normal axis, normal intervals.  Normal QRS ST segments T waves.   RADIOLOGY Chest x-ray interpreted by me, shows large left pleural effusion.  Radiology report reviewed   PROCEDURES:  .Critical Care  Performed by: Sharman Cheek, MD Authorized by: Sharman Cheek, MD   Critical care provider statement:    Critical care time (minutes):  35   Critical care time was exclusive of:  Separately billable procedures and treating other patients   Critical care was necessary to treat or prevent imminent or life-threatening deterioration of the following conditions:  Respiratory failure and hepatic failure   Critical care was time spent personally by me on the following activities:  Development of treatment plan with patient or surrogate, discussions with consultants,  evaluation of patient's response to treatment, examination of patient, obtaining history from patient or surrogate, ordering and performing treatments and interventions, ordering and review of laboratory studies, ordering and review of radiographic studies, pulse oximetry, re-evaluation of patient's condition and review of old charts   Care discussed with: accepting provider at another facility      MEDICATIONS ORDERED IN ED: Medications  lidocaine-EPINEPHrine (XYLOCAINE W/EPI) 2 %-1:100000 (with pres) injection 20 mL (has no administration in time range)  furosemide (LASIX) injection 40 mg (has no administration in time range)     IMPRESSION / MDM / ASSESSMENT AND PLAN / ED COURSE  I reviewed the triage vital signs and the nursing notes.  DDx: Pleural effusion, pneumonia, pneumothorax, AKI, electrolyte abnormality  Patient's presentation is most consistent with acute presentation with potential threat to life or bodily function.     Clinical Course as of 01/21/23 2127  Sat Jan 21, 2023  1657 Patient presents with rapidly recurrent large left pleural effusion/hydrothorax causing hypoxia.  Will contact her gastroenterologist at [facility] for recommendations regarding thoracentesis volume, possible transfer.  Albumin level is 3.0.  Platelets 51,000, INR 1.9.  with lab abnormalities of coagulopathy, will defer thoracentesis to IR unless pt decompensates further. [PS]  1752 D/w [facility] Hepatologist Dr. Corky Sing who recommends transfer to [facility] for further eval of decompensated liver disease despite TIPS. Transfer Center will contact admitting team to discuss [PS]  1900 Case d/w [facility] Dr. Kyla Balzarine who accepts for transfer. Requests [facility] [place] to eval for TIPS patency and ascites [PS]  2035 Pt updated. No worsening of symptoms. Remains 94% on 4L  [PS]    Clinical Course User Index [PS] Sharman Cheek, MD     FINAL CLINICAL IMPRESSION(S) / ED DIAGNOSES   Final diagnoses:  Cirrhosis of liver  without ascites, unspecified hepatic cirrhosis type (HCC)  Pleural effusion associated with hepatic disorder  Acute respiratory failure with hypoxia (HCC)     Rx / DC Orders   ED Discharge Orders     None        Note:  This document was prepared using Dragon voice recognition software and may include unintentional dictation errors.   Sharman Cheek, MD 01/21/23 2127

## 2023-01-21 NOTE — ED Notes (Signed)
Called [facility] spoke with [place], she stated both their trucks are out and she has put the pt. on the list and she will reach out to when a truck is available.

## 2023-01-21 NOTE — ED Notes (Signed)
Per Dr. Scotty Court: He spoke with the [facility] hospitalist who accepts for transfer, no word yet on if bed will be available tonight.

## 2023-01-21 NOTE — ED Triage Notes (Signed)
Pt to ED via POV from home. Pt has liver cirrhosis. Pt had TIPS procedure 10/10. Pt also had thoracentesis on Thursday. Pt reports swelling in legs, increased SOB and palpitations.

## 2023-01-21 NOTE — ED Notes (Signed)
[facility] called spoke with rep.Albertine Grates stated pt. will be coming to [address] [address], [facility] [address] RM. [phone]. The accepting Dr. is Dr. Kyla Balzarine and the report number is [phone]. Calling [facility] for transportation.

## 2023-01-21 NOTE — ED Notes (Signed)
Called [facility] and spoke with rep Onalee Hua, he stated they would put the pt on the list, but did not have and ETA for pick up.

## 2023-01-21 NOTE — ED Notes (Signed)
During shift change per Hulan Amato: Patient in room 14 is waiting for [facility] GI provider to call Dr. Scotty Court back about an admission.  Thanks   TV Spoke to [place] at [facility] about Transfer for patient in room 14.  At shift change [facility] had not provided [facility] with a bed assignment, accepting physician, or a number for report. We are currently waiting to hear back from [facility] with an update.

## 2023-01-31 ENCOUNTER — Ambulatory Visit
Admission: RE | Admit: 2023-01-31 | Discharge: 2023-01-31 | Disposition: A | Discharge status: Home / Self Care | Payer: BC Managed Care – PPO | Source: Ambulatory Visit | Attending: Internal Medicine | Admitting: Internal Medicine

## 2023-01-31 ENCOUNTER — Ambulatory Visit
Admission: RE | Admit: 2023-01-31 | Discharge: 2023-01-31 | Disposition: A | Discharge status: Home / Self Care | Payer: BC Managed Care – PPO | Source: Ambulatory Visit | Attending: Radiology | Admitting: Radiology

## 2023-01-31 ENCOUNTER — Other Ambulatory Visit: Payer: Self-pay | Admitting: Radiology

## 2023-01-31 DIAGNOSIS — J90 Pleural effusion, not elsewhere classified: Secondary | ICD-10-CM | POA: Diagnosis not present

## 2023-01-31 DIAGNOSIS — K760 Fatty (change of) liver, not elsewhere classified: Secondary | ICD-10-CM | POA: Insufficient documentation

## 2023-01-31 DIAGNOSIS — Z9889 Other specified postprocedural states: Secondary | ICD-10-CM | POA: Diagnosis not present

## 2023-01-31 LAB — BODY FLUID CELL COUNT WITH DIFFERENTIAL
Eos, Fluid: 0 %
Lymphs, Fluid: 19 %
Monocyte-Macrophage-Serous Fluid: 71 %
Neutrophil Count, Fluid: 10 %
Total Nucleated Cell Count, Fluid: 145 uL

## 2023-01-31 LAB — PATHOLOGIST SMEAR REVIEW

## 2023-01-31 MED ORDER — LIDOCAINE HCL (PF) 1 % IJ SOLN
10.0000 mL | Freq: Once | INTRAMUSCULAR | Status: AC
Start: 2023-01-31 — End: 2023-01-31
  Administered 2023-01-31: 10 mL via INTRADERMAL
  Filled 2023-01-31: qty 10

## 2023-01-31 NOTE — Procedures (Signed)
Ultrasound-guided diagnostic and therapeutic left sided thoracentesis performed yielding 1.2 liters of amber colored fluid. No immediate complications.   Diagnostic fluid was sent to the lab for further analysis. Follow-up chest x-ray pending. EBL is > 2 ml. Marland Kitchen

## 2023-02-06 ENCOUNTER — Other Ambulatory Visit: Payer: Self-pay | Admitting: Transplant Hepatology

## 2023-02-06 ENCOUNTER — Ambulatory Visit
Admission: RE | Admit: 2023-02-06 | Discharge: 2023-02-06 | Disposition: A | Discharge status: Home / Self Care | Payer: BC Managed Care – PPO | Source: Ambulatory Visit | Attending: Transplant Hepatology | Admitting: Transplant Hepatology

## 2023-02-06 DIAGNOSIS — R188 Other ascites: Secondary | ICD-10-CM | POA: Insufficient documentation

## 2023-02-06 LAB — BODY FLUID CELL COUNT WITH DIFFERENTIAL
Eos, Fluid: 0 %
Lymphs, Fluid: 40 %
Monocyte-Macrophage-Serous Fluid: 53 %
Neutrophil Count, Fluid: 7 %
Total Nucleated Cell Count, Fluid: 109 uL

## 2023-02-06 LAB — PATHOLOGIST SMEAR REVIEW

## 2023-02-06 MED ORDER — ALBUMIN HUMAN 25 % IV SOLN
INTRAVENOUS | Status: AC
  Filled 2023-02-06: qty 100

## 2023-02-06 MED ORDER — LIDOCAINE HCL (PF) 1 % IJ SOLN
10.0000 mL | Freq: Once | INTRAMUSCULAR | Status: AC
Start: 2023-02-06 — End: 2023-02-06
  Administered 2023-02-06: 10 mL via INTRADERMAL
  Filled 2023-02-06: qty 10

## 2023-02-06 MED ORDER — ALBUMIN HUMAN 25 % IV SOLN
25.0000 g | Freq: Once | INTRAVENOUS | Status: AC
  Administered 2023-02-06: 25 g via INTRAVENOUS

## 2023-02-06 NOTE — Procedures (Signed)
Ultrasound-guided diagnostic and therapeutic paracentesis performed yielding 5 liters of straw colored fluid.  Fluid was sent to lab for analysis. No immediate complications. EBL is none.

## 2023-02-09 ENCOUNTER — Other Ambulatory Visit: Payer: Self-pay | Admitting: Transplant Hepatology

## 2023-02-09 DIAGNOSIS — R188 Other ascites: Secondary | ICD-10-CM

## 2023-02-10 LAB — BODY FLUID CULTURE W GRAM STAIN: Culture: NO GROWTH

## 2023-02-11 LAB — AEROBIC/ANAEROBIC CULTURE W GRAM STAIN (SURGICAL/DEEP WOUND)
Culture: NO GROWTH
Gram Stain: NONE SEEN

## 2023-02-13 ENCOUNTER — Other Ambulatory Visit: Payer: Self-pay | Admitting: Internal Medicine

## 2023-02-13 ENCOUNTER — Ambulatory Visit
Admission: RE | Admit: 2023-02-13 | Discharge: 2023-02-13 | Disposition: A | Discharge status: Home / Self Care | Payer: BC Managed Care – PPO | Source: Ambulatory Visit | Attending: Transplant Hepatology | Admitting: Transplant Hepatology

## 2023-02-13 DIAGNOSIS — J90 Pleural effusion, not elsewhere classified: Secondary | ICD-10-CM

## 2023-02-13 DIAGNOSIS — K760 Fatty (change of) liver, not elsewhere classified: Secondary | ICD-10-CM

## 2023-02-13 DIAGNOSIS — R188 Other ascites: Secondary | ICD-10-CM | POA: Insufficient documentation

## 2023-02-13 LAB — BODY FLUID CELL COUNT WITH DIFFERENTIAL
Eos, Fluid: 0 %
Lymphs, Fluid: 28 %
Monocyte-Macrophage-Serous Fluid: 50 %
Neutrophil Count, Fluid: 22 %
Total Nucleated Cell Count, Fluid: 265 uL

## 2023-02-13 MED ORDER — ALBUMIN HUMAN 25 % IV SOLN
25.0000 g | Freq: Once | INTRAVENOUS | Status: AC
  Administered 2023-02-13: 25 g via INTRAVENOUS

## 2023-02-13 MED ORDER — LIDOCAINE HCL (PF) 1 % IJ SOLN
10.0000 mL | Freq: Once | INTRAMUSCULAR | Status: AC
Start: 2023-02-13 — End: 2023-02-13
  Administered 2023-02-13: 10 mL via INTRADERMAL
  Filled 2023-02-13: qty 10

## 2023-02-13 MED ORDER — ALBUMIN HUMAN 25 % IV SOLN
INTRAVENOUS | Status: AC
  Filled 2023-02-13: qty 100

## 2023-02-13 NOTE — Procedures (Signed)
PROCEDURE SUMMARY:  Successful ultrasound guided paracentesis from the left lower quadrant.  Yielded 4.6 L of clear yellow fluid.  No immediate complications.  The patient tolerated the procedure well.   Specimen sent for labs.  EBL < 2 mL  Patient is followed by [facility]. She is s/p TIPS 12/15/22 followed by a revision in November 2024.  Alwyn Ren, [place] [phone] 02/13/2023, 10:31 AM

## 2023-02-13 NOTE — Discharge Instructions (Signed)
Discharge instructions reviewed with patient.

## 2023-02-14 ENCOUNTER — Telehealth (HOSPITAL_COMMUNITY): Payer: Self-pay | Admitting: Student

## 2023-02-14 ENCOUNTER — Ambulatory Visit
Admission: RE | Admit: 2023-02-14 | Discharge: 2023-02-14 | Disposition: A | Discharge status: Home / Self Care | Payer: BC Managed Care – PPO | Source: Ambulatory Visit | Attending: Internal Medicine | Admitting: Internal Medicine

## 2023-02-14 ENCOUNTER — Other Ambulatory Visit: Payer: Self-pay | Admitting: Radiology

## 2023-02-14 ENCOUNTER — Ambulatory Visit
Admission: RE | Admit: 2023-02-14 | Discharge: 2023-02-14 | Disposition: A | Discharge status: Home / Self Care | Payer: BC Managed Care – PPO | Source: Ambulatory Visit | Attending: Radiology | Admitting: Radiology

## 2023-02-14 DIAGNOSIS — K760 Fatty (change of) liver, not elsewhere classified: Secondary | ICD-10-CM

## 2023-02-14 DIAGNOSIS — J90 Pleural effusion, not elsewhere classified: Secondary | ICD-10-CM | POA: Diagnosis present

## 2023-02-14 DIAGNOSIS — Z9889 Other specified postprocedural states: Secondary | ICD-10-CM

## 2023-02-14 LAB — BODY FLUID CELL COUNT WITH DIFFERENTIAL
Eos, Fluid: 0 %
Lymphs, Fluid: 20 %
Monocyte-Macrophage-Serous Fluid: 11 % — ABNORMAL LOW (ref 50–90)
Neutrophil Count, Fluid: 69 % — ABNORMAL HIGH (ref 0–25)
Total Nucleated Cell Count, Fluid: 217 uL (ref 0–1000)

## 2023-02-14 LAB — PATHOLOGIST SMEAR REVIEW

## 2023-02-14 MED ORDER — LIDOCAINE HCL (PF) 1 % IJ SOLN
10.0000 mL | Freq: Once | INTRAMUSCULAR | Status: AC
  Administered 2023-02-14: 10 mL via INTRADERMAL
  Filled 2023-02-14: qty 10

## 2023-02-14 NOTE — Telephone Encounter (Signed)
Patient familiar to IR from multiple paracenteses and thoracenteses. She is followed by [facility] and underwent TIPS 12/15/22 followed by a revision mid-November. The patient was seen twice this week by [facility] for a para (12/9) and a thora (12/10) and she expressed frustration over the continued fluid volume overload making these procedures a continued and regular requirement.   She asked me how she might obtain a second opinion and I ended up reaching out to the [facility] Hepatology team who follows her. I spoke with Dr. Daleen Squibb today who shared the patient is clinically close to being ready for placement on the transplant list. The patient has a few boxes she still needs to check off including a colon cancer screening. She also needs to completely abstain from tobacco use. Dr. Daleen Squibb and I also discussed reasonable expectations post-TIPS in terms of the length of time before a patient will likely see a significant decrease in fluid volume reduction. This can often take many months.   Dr. Daleen Squibb is agreeable to referring the patient to [facility] for a second opinion if the patient wishes. At this time our team feels the patient is getting the appropriate care with her [facility] team and she/they are on the right path. I called Alyxa this afternoon and shared all of this information with her. She explained she is trying to quit smoking and she is working on meeting the other requirements to be placed on the transplant list. I told her that our recommendations are for her to follow through with her [facility] team to hopefully get a liver transplant soon but that our team is here for her for paras/thoras. I also shared that if a time comes that she does want to be formally evaluated by our team that Dr. Daleen Squibb would place a referral request.   Maryah expressed appreciation for our phone call today and she is thankful to know that the [facility] team is still working towards transplant. Ovell will  follow up with Dr. Daleen Squibb in January but she has the number to the IR APP office at [facility] and knows she can call me at any time with questions/concerns.  Alwyn Ren, [place] [phone] 02/14/2023, 3:44 PM

## 2023-02-14 NOTE — Procedures (Signed)
Ultrasound-guided diagnostic and therapeutic left sided thoracentesis performed yielding 1.2 liters of amber colored fluid. No immediate complications.   Diagnostic fluid was sent to the lab for further analysis. Follow-up chest x-ray pending. EBL is > 2 ml. Marland Kitchen

## 2023-02-16 ENCOUNTER — Other Ambulatory Visit: Payer: Self-pay | Admitting: Transplant Hepatology

## 2023-02-16 ENCOUNTER — Other Ambulatory Visit: Payer: Self-pay | Admitting: Internal Medicine

## 2023-02-16 DIAGNOSIS — R188 Other ascites: Secondary | ICD-10-CM

## 2023-02-16 DIAGNOSIS — J90 Pleural effusion, not elsewhere classified: Secondary | ICD-10-CM

## 2023-02-16 DIAGNOSIS — K760 Fatty (change of) liver, not elsewhere classified: Secondary | ICD-10-CM

## 2023-02-16 LAB — BODY FLUID CULTURE W GRAM STAIN

## 2023-02-20 ENCOUNTER — Ambulatory Visit: Payer: BC Managed Care – PPO

## 2023-02-27 ENCOUNTER — Ambulatory Visit
Admission: RE | Admit: 2023-02-27 | Discharge: 2023-02-27 | Disposition: A | Discharge status: Home / Self Care | Payer: BC Managed Care – PPO | Source: Ambulatory Visit | Attending: Transplant Hepatology | Admitting: Transplant Hepatology

## 2023-02-27 DIAGNOSIS — R188 Other ascites: Secondary | ICD-10-CM | POA: Diagnosis present

## 2023-02-27 LAB — BODY FLUID CELL COUNT WITH DIFFERENTIAL
Eos, Fluid: 0 %
Lymphs, Fluid: 53 %
Monocyte-Macrophage-Serous Fluid: 38 %
Neutrophil Count, Fluid: 9 %
Total Nucleated Cell Count, Fluid: 96 uL

## 2023-02-27 MED ORDER — LIDOCAINE HCL (PF) 1 % IJ SOLN
10.0000 mL | Freq: Once | INTRAMUSCULAR | Status: AC
  Administered 2023-02-27: 10 mL via INTRADERMAL
  Filled 2023-02-27: qty 10

## 2023-02-27 MED ORDER — ALBUMIN HUMAN 25 % IV SOLN
25.0000 g | Freq: Once | INTRAVENOUS | Status: AC
  Administered 2023-02-27: 25 g via INTRAVENOUS

## 2023-02-27 MED ORDER — ALBUMIN HUMAN 25 % IV SOLN
INTRAVENOUS | Status: AC
  Filled 2023-02-27: qty 100

## 2023-02-28 ENCOUNTER — Ambulatory Visit
Admission: RE | Admit: 2023-02-28 | Discharge: 2023-02-28 | Disposition: A | Discharge status: Home / Self Care | Payer: BC Managed Care – PPO | Source: Ambulatory Visit | Attending: Radiology | Admitting: Radiology

## 2023-02-28 ENCOUNTER — Other Ambulatory Visit: Payer: Self-pay | Admitting: Radiology

## 2023-02-28 ENCOUNTER — Ambulatory Visit
Admission: RE | Admit: 2023-02-28 | Discharge: 2023-02-28 | Disposition: A | Discharge status: Home / Self Care | Payer: BC Managed Care – PPO | Source: Ambulatory Visit | Attending: Internal Medicine | Admitting: Internal Medicine

## 2023-02-28 DIAGNOSIS — K760 Fatty (change of) liver, not elsewhere classified: Secondary | ICD-10-CM | POA: Insufficient documentation

## 2023-02-28 DIAGNOSIS — K743 Primary biliary cirrhosis: Secondary | ICD-10-CM | POA: Insufficient documentation

## 2023-02-28 DIAGNOSIS — J90 Pleural effusion, not elsewhere classified: Secondary | ICD-10-CM | POA: Insufficient documentation

## 2023-02-28 DIAGNOSIS — Z9889 Other specified postprocedural states: Secondary | ICD-10-CM

## 2023-02-28 LAB — BODY FLUID CELL COUNT WITH DIFFERENTIAL
Eos, Fluid: 0 %
Lymphs, Fluid: 35 %
Monocyte-Macrophage-Serous Fluid: 47 %
Neutrophil Count, Fluid: 18 %
Total Nucleated Cell Count, Fluid: 96 uL

## 2023-02-28 LAB — PATHOLOGIST SMEAR REVIEW

## 2023-02-28 MED ORDER — LIDOCAINE HCL (PF) 1 % IJ SOLN
10.0000 mL | Freq: Once | INTRAMUSCULAR | Status: AC
  Administered 2023-02-28: 10 mL via INTRADERMAL
  Filled 2023-02-28: qty 10

## 2023-03-02 ENCOUNTER — Ambulatory Visit
Admission: RE | Admit: 2023-03-02 | Discharge: 2023-03-02 | Disposition: A | Discharge status: Home / Self Care | Payer: BC Managed Care – PPO | Source: Ambulatory Visit | Attending: Transplant Hepatology | Admitting: Transplant Hepatology

## 2023-03-02 DIAGNOSIS — R188 Other ascites: Secondary | ICD-10-CM | POA: Diagnosis present

## 2023-03-02 MED ORDER — LIDOCAINE HCL (PF) 1 % IJ SOLN
20.0000 mL | Freq: Once | INTRAMUSCULAR | Status: AC
  Administered 2023-03-02: 20 mL via INTRADERMAL
  Filled 2023-03-02: qty 20

## 2023-03-02 MED ORDER — LIDOCAINE HCL (PF) 1 % IJ SOLN: 10.0000 mL | Freq: Once | INTRAMUSCULAR | Status: DC

## 2023-03-02 NOTE — Procedures (Signed)
PROCEDURE SUMMARY:  Successful ultrasound guided paracentesis from the left lower quadrant.  Yielded 4 L of clear yellow fluid.  No immediate complications.  The patient tolerated the procedure well.   Specimen sent for labs.  EBL < 2 mL  PLAN: Patient is followed by [facility]. She underwent TIPS 12/15/2022 followed by revision in November.  Alwyn Ren, AGACNP-BC 03/02/2023, 3:21 PM

## 2023-03-03 LAB — BODY FLUID CULTURE W GRAM STAIN
Culture: NO GROWTH
Gram Stain: NONE SEEN

## 2023-03-04 LAB — ANAEROBIC CULTURE W GRAM STAIN

## 2023-03-06 ENCOUNTER — Ambulatory Visit
Admission: RE | Admit: 2023-03-06 | Discharge: 2023-03-06 | Disposition: A | Discharge status: Home / Self Care | Payer: BC Managed Care – PPO | Source: Ambulatory Visit | Attending: Transplant Hepatology | Admitting: Transplant Hepatology

## 2023-03-06 DIAGNOSIS — R188 Other ascites: Secondary | ICD-10-CM | POA: Diagnosis present

## 2023-03-06 DIAGNOSIS — K7460 Unspecified cirrhosis of liver: Secondary | ICD-10-CM | POA: Insufficient documentation

## 2023-03-06 LAB — BODY FLUID CELL COUNT WITH DIFFERENTIAL
Eos, Fluid: 0 %
Lymphs, Fluid: 36 %
Monocyte-Macrophage-Serous Fluid: 47 %
Neutrophil Count, Fluid: 17 %
Total Nucleated Cell Count, Fluid: 74 uL

## 2023-03-06 LAB — PATHOLOGIST SMEAR REVIEW

## 2023-03-06 MED ORDER — ALBUMIN HUMAN 25 % IV SOLN
25.0000 g | Freq: Once | INTRAVENOUS | Status: AC
Start: 2023-03-06 — End: 2023-03-06
  Administered 2023-03-06: 25 g via INTRAVENOUS

## 2023-03-06 MED ORDER — ALBUMIN HUMAN 25 % IV SOLN
INTRAVENOUS | Status: AC
  Filled 2023-03-06: qty 100

## 2023-03-06 MED ORDER — LIDOCAINE HCL (PF) 1 % IJ SOLN
10.0000 mL | Freq: Once | INTRAMUSCULAR | Status: AC
  Administered 2023-03-06: 10 mL via INTRADERMAL
  Filled 2023-03-06: qty 10

## 2023-03-06 NOTE — Discharge Instructions (Signed)
Reviewed with patient

## 2023-03-06 NOTE — Procedures (Signed)
PROCEDURE SUMMARY:  Successful US guided paracentesis from the right lower abdomen.  Yielded 4.6L of straw yellow fluid.  No immediate complications.  Pt tolerated well.    EBL < 5mL  Ziggy Chanthavong N Patti Shorb PA-C 03/06/2023 10:07 AM

## 2023-03-09 LAB — BODY FLUID CULTURE W GRAM STAIN

## 2023-03-10 ENCOUNTER — Ambulatory Visit
Admission: RE | Admit: 2023-03-10 | Discharge: 2023-03-10 | Disposition: A | Discharge status: Home / Self Care | Payer: BC Managed Care – PPO | Source: Ambulatory Visit | Attending: Diagnostic Radiology | Admitting: Diagnostic Radiology

## 2023-03-10 ENCOUNTER — Other Ambulatory Visit: Payer: Self-pay | Admitting: Diagnostic Radiology

## 2023-03-10 DIAGNOSIS — J90 Pleural effusion, not elsewhere classified: Secondary | ICD-10-CM

## 2023-03-10 HISTORY — PX: IR THORACENTESIS ASP PLEURAL SPACE W/IMG GUIDE: IMG5380

## 2023-03-10 LAB — BODY FLUID CELL COUNT WITH DIFFERENTIAL
Eos, Fluid: 0 %
Lymphs, Fluid: 41 %
Monocyte-Macrophage-Serous Fluid: 44 %
Neutrophil Count, Fluid: 15 %
Total Nucleated Cell Count, Fluid: 96 uL

## 2023-03-10 MED ORDER — LIDOCAINE HCL 1 % IJ SOLN
8.0000 mL | Freq: Once | INTRAMUSCULAR | Status: AC
  Administered 2023-03-10: 8 mL via INTRADERMAL

## 2023-03-10 MED ORDER — LIDOCAINE HCL 1 % IJ SOLN
INTRAMUSCULAR | Status: AC
  Filled 2023-03-10: qty 20

## 2023-03-10 NOTE — Procedures (Signed)
 Interventional Radiology Procedure:   Indications: Shortness of breath and recurrent left pleural effusion  Procedure: US  guided thoracentesis  Findings: Removed 2200 ml from left chest.  Complications: None     EBL: Less than 10 ml  Plan: Follow up CXR   Kristin Brooks R. Philip, MD  Pager: [phone]

## 2023-03-11 LAB — ANAEROBIC CULTURE W GRAM STAIN

## 2023-03-13 ENCOUNTER — Ambulatory Visit
Admission: RE | Admit: 2023-03-13 | Discharge: 2023-03-13 | Disposition: A | Discharge status: Home / Self Care | Payer: BC Managed Care – PPO | Source: Ambulatory Visit | Attending: Transplant Hepatology | Admitting: Transplant Hepatology

## 2023-03-13 DIAGNOSIS — K8301 Primary sclerosing cholangitis: Secondary | ICD-10-CM | POA: Diagnosis not present

## 2023-03-13 DIAGNOSIS — R188 Other ascites: Secondary | ICD-10-CM | POA: Diagnosis present

## 2023-03-13 LAB — BODY FLUID CELL COUNT WITH DIFFERENTIAL
Eos, Fluid: 0 %
Lymphs, Fluid: 24 %
Monocyte-Macrophage-Serous Fluid: 64 %
Neutrophil Count, Fluid: 12 %
Total Nucleated Cell Count, Fluid: 66 uL

## 2023-03-13 LAB — PATHOLOGIST SMEAR REVIEW

## 2023-03-13 MED ORDER — LIDOCAINE HCL (PF) 1 % IJ SOLN
10.0000 mL | Freq: Once | INTRAMUSCULAR | Status: AC
  Administered 2023-03-13: 10 mL via INTRADERMAL
  Filled 2023-03-13: qty 10

## 2023-03-13 MED ORDER — ALBUMIN HUMAN 25 % IV SOLN
25.0000 g | Freq: Once | INTRAVENOUS | Status: AC
Start: 2023-03-13 — End: 2023-03-13
  Administered 2023-03-13: 25 g via INTRAVENOUS

## 2023-03-13 MED ORDER — ALBUMIN HUMAN 25 % IV SOLN
INTRAVENOUS | Status: AC
  Filled 2023-03-13: qty 100

## 2023-03-13 NOTE — Procedures (Signed)
 PROCEDURE SUMMARY:  Successful US  guided paracentesis from the left lower abdomen.  Yielded 5L of clear yellow fluid.  No immediate complications.  Pt tolerated well.   Specimen was sent for labs.  EBL < 5mL  Deborha Moseley N Jakeya Gherardi PA-C 03/13/2023 11:08 AM

## 2023-03-14 ENCOUNTER — Ambulatory Visit: Payer: BC Managed Care – PPO

## 2023-03-17 LAB — BODY FLUID CULTURE W GRAM STAIN
Culture: NO GROWTH
Gram Stain: NONE SEEN

## 2023-03-20 ENCOUNTER — Other Ambulatory Visit: Payer: Self-pay | Admitting: Transplant Hepatology

## 2023-03-20 ENCOUNTER — Ambulatory Visit: Payer: BC Managed Care – PPO

## 2023-03-20 DIAGNOSIS — R188 Other ascites: Secondary | ICD-10-CM

## 2023-03-27 ENCOUNTER — Ambulatory Visit: Payer: BC Managed Care – PPO

## 2023-03-28 ENCOUNTER — Ambulatory Visit: Payer: BC Managed Care – PPO

## 2023-04-03 ENCOUNTER — Ambulatory Visit: Payer: BC Managed Care – PPO

## 2023-04-08 DEATH — deceased

## 2023-04-10 ENCOUNTER — Ambulatory Visit: Payer: BC Managed Care – PPO

## 2023-04-11 ENCOUNTER — Ambulatory Visit: Payer: BC Managed Care – PPO

## 2023-04-17 ENCOUNTER — Ambulatory Visit: Payer: BC Managed Care – PPO

## 2023-04-24 ENCOUNTER — Ambulatory Visit: Payer: BC Managed Care – PPO
